# Patient Record
Sex: Male | Born: 1960
Health system: Southern US, Community
[De-identification: ages and names within clinical notes are randomized; demographics above are authoritative.]

## PROBLEM LIST (undated history)

## (undated) DIAGNOSIS — K635 Polyp of colon: Secondary | ICD-10-CM

## (undated) DIAGNOSIS — F419 Anxiety disorder, unspecified: Secondary | ICD-10-CM

## (undated) DIAGNOSIS — I10 Essential (primary) hypertension: Secondary | ICD-10-CM

## (undated) HISTORY — DX: Essential (primary) hypertension: I10

## (undated) HISTORY — DX: Anxiety disorder, unspecified: F41.9

## (undated) HISTORY — PX: OTHER SURGICAL HISTORY: SHX169

## (undated) HISTORY — PX: COLONOSCOPY: SHX174

## (undated) HISTORY — DX: Polyp of colon: K63.5

## (undated) HISTORY — PX: LUNG SURGERY: SHX703

---

## 2008-07-03 ENCOUNTER — Ambulatory Visit: Payer: Self-pay | Admitting: Family Medicine

## 2008-07-03 DIAGNOSIS — J069 Acute upper respiratory infection, unspecified: Secondary | ICD-10-CM | POA: Insufficient documentation

## 2011-07-01 ENCOUNTER — Ambulatory Visit: Payer: Self-pay | Admitting: Family Medicine

## 2011-10-15 ENCOUNTER — Ambulatory Visit (INDEPENDENT_AMBULATORY_CARE_PROVIDER_SITE_OTHER): Payer: BC Managed Care – PPO | Admitting: Physician Assistant

## 2011-10-15 ENCOUNTER — Encounter: Payer: Self-pay | Admitting: Physician Assistant

## 2011-10-15 VITALS — BP 118/80 | HR 91 | Ht 70.0 in | Wt 146.0 lb

## 2011-10-15 DIAGNOSIS — Z129 Encounter for screening for malignant neoplasm, site unspecified: Secondary | ICD-10-CM

## 2011-10-15 DIAGNOSIS — Z1211 Encounter for screening for malignant neoplasm of colon: Secondary | ICD-10-CM

## 2011-10-15 DIAGNOSIS — Z716 Tobacco abuse counseling: Secondary | ICD-10-CM

## 2011-10-15 DIAGNOSIS — Z23 Encounter for immunization: Secondary | ICD-10-CM

## 2011-10-15 DIAGNOSIS — Z0000 Encounter for general adult medical examination without abnormal findings: Secondary | ICD-10-CM

## 2011-10-15 DIAGNOSIS — F17200 Nicotine dependence, unspecified, uncomplicated: Secondary | ICD-10-CM

## 2011-10-15 MED ORDER — BUPROPION HCL ER (SR) 150 MG PO TB12: 150.0000 mg | ORAL_TABLET | Freq: Two times a day (BID) | ORAL | Status: DC

## 2011-10-15 NOTE — Patient Instructions (Addendum)
Will give prescription for Wellbutrin for smoking cessation. Can prescribe for another month if find this is helping. Will call with lab results. Will refer for colonoscopy in September and CT screening. Gave tetanus today. Continue to maintain an healthy diet with regular exercise. Try to get 4 servings of dairy daily.    Smoking Cessation This document explains the best ways for you to quit smoking and new treatments to help. It lists new medicines that can double or triple your chances of quitting and quitting for good. It also considers ways to avoid relapses and concerns you may have about quitting, including weight gain. NICOTINE: A POWERFUL ADDICTION If you have tried to quit smoking, you know how hard it can be. It is hard because nicotine is a very addictive drug. For some people, it can be as addictive as heroin or cocaine. Usually, people make 2 or 3 tries, or more, before finally being able to quit. Each time you try to quit, you can learn about what helps and what hurts. Quitting takes hard work and a lot of effort, but you can quit smoking. QUITTING SMOKING IS ONE OF THE MOST IMPORTANT THINGS YOU WILL EVER DO.  You will live longer, feel better, and live better.   The impact on your body of quitting smoking is felt almost immediately:   Within 20 minutes, blood pressure decreases. Pulse returns to its normal level.   After 8 hours, carbon monoxide levels in the blood return to normal. Oxygen level increases.   After 24 hours, chance of heart attack starts to decrease. Breath, hair, and body stop smelling like smoke.   After 48 hours, damaged nerve endings begin to recover. Sense of taste and smell improve.   After 72 hours, the body is virtually free of nicotine. Bronchial tubes relax and breathing becomes easier.   After 2 to 12 weeks, lungs can hold more air. Exercise becomes easier and circulation improves.   Quitting will reduce your risk of having a heart attack, stroke,  cancer, or lung disease:   After 1 year, the risk of coronary heart disease is cut in half.   After 5 years, the risk of stroke falls to the same as a nonsmoker.   After 10 years, the risk of lung cancer is cut in half and the risk of other cancers decreases significantly.   After 15 years, the risk of coronary heart disease drops, usually to the level of a nonsmoker.   If you are pregnant, quitting smoking will improve your chances of having a healthy baby.   The people you live with, especially your children, will be healthier.   You will have extra money to spend on things other than cigarettes.  FIVE KEYS TO QUITTING Studies have shown that these 5 steps will help you quit smoking and quit for good. You have the best chances of quitting if you use them together: 1. Get ready.  2. Get support and encouragement.  3. Learn new skills and behaviors.  4. Get medicine to reduce your nicotine addiction and use it correctly.  5. Be prepared for relapse or difficult situations. Be determined to continue trying to quit, even if you do not succeed at first.  1. GET READY  Set a quit date.   Change your environment.   Get rid of ALL cigarettes, ashtrays, matches, and lighters in your home, car, and place of work.   Do not let people smoke in your home.   Review your  past attempts to quit. Think about what worked and what did not.   Once you quit, do not smoke. NOT EVEN A PUFF!  2. GET SUPPORT AND ENCOURAGEMENT Studies have shown that you have a better chance of being successful if you have help. You can get support in many ways.  Tell your family, friends, and coworkers that you are going to quit and need their support. Ask them not to smoke around you.   Talk to your caregivers (doctor, dentist, nurse, pharmacist, psychologist, and/or smoking counselor).   Get individual, group, or telephone counseling and support. The more counseling you have, the better your chances are of  quitting. Programs are available at [employer] and health centers. Call your local health department for information about programs in your area.   Spiritual beliefs and practices may help some smokers quit.   Quit meters are Photographer that keep track of quit statistics, such as amount of "quit-time," cigarettes not smoked, and money saved.   Many smokers find one or more of the many self-help books available useful in helping them quit and stay off tobacco.  3. LEARN NEW SKILLS AND BEHAVIORS  Try to distract yourself from urges to smoke. Talk to someone, go for a walk, or occupy your time with a task.   When you first try to quit, change your routine. Take a different route to work. Drink tea instead of coffee. Eat breakfast in a different place.   Do something to reduce your stress. Take a hot bath, exercise, or read a book.   Plan something enjoyable to do every day. Reward yourself for not smoking.   Explore interactive web-based programs that specialize in helping you quit.  4. GET MEDICINE AND USE IT CORRECTLY Medicines can help you stop smoking and decrease the urge to smoke. Combining medicine with the above behavioral methods and support can quadruple your chances of successfully quitting smoking. The U[place]. Food and Drug Administration (FDA) has approved 7 medicines to help you quit smoking. These medicines fall into 3 categories.  Nicotine replacement therapy (delivers nicotine to your body without the negative effects and risks of smoking):   Nicotine gum: Available over-the-counter.   Nicotine lozenges: Available over-the-counter.   Nicotine inhaler: Available by prescription.   Nicotine nasal spray: Available by prescription.   Nicotine skin patches (transdermal): Available by prescription and over-the-counter.   Antidepressant medicine (helps people abstain from smoking, but how this works is unknown):   Bupropion  sustained-release (SR) tablets: Available by prescription.   Nicotinic receptor partial agonist (simulates the effect of nicotine in your brain):   Varenicline tartrate tablets: Available by prescription.   Ask your caregiver for advice about which medicines to use and how to use them. Carefully read the information on the package.   Everyone who is trying to quit may benefit from using a medicine. If you are pregnant or trying to become pregnant, nursing an infant, you are under age 72, or you smoke fewer than 10 cigarettes per day, talk to your caregiver before taking any nicotine replacement medicines.   You should stop using a nicotine replacement product and call your caregiver if you experience nausea, dizziness, weakness, vomiting, fast or irregular heartbeat, mouth problems with the lozenge or gum, or redness or swelling of the skin around the patch that does not go away.   Do not use any other product containing nicotine while using a nicotine replacement product.   Talk to  your caregiver before using these products if you have diabetes, heart disease, asthma, stomach ulcers, you had a recent heart attack, you have high blood pressure that is not controlled with medicine, a history of irregular heartbeat, or you have been prescribed medicine to help you quit smoking.  5. BE PREPARED FOR RELAPSE OR DIFFICULT SITUATIONS  Most relapses occur within the first 3 months after quitting. Do not be discouraged if you start smoking again. Remember, most people try several times before they finally quit.   You may have symptoms of withdrawal because your body is used to nicotine. You may crave cigarettes, be irritable, feel very hungry, cough often, get headaches, or have difficulty concentrating.   The withdrawal symptoms are only temporary. They are strongest when you first quit, but they will go away within 10 to 14 days.  Here are some difficult situations to watch for:  Alcohol. Avoid  drinking alcohol. Drinking lowers your chances of successfully quitting.   Caffeine. Try to reduce the amount of caffeine you consume. It also lowers your chances of successfully quitting.   Other smokers. Being around smoking can make you want to smoke. Avoid smokers.   Weight gain. Many smokers will gain weight when they quit, usually less than 10 pounds. Eat a healthy diet and stay active. Do not let weight gain distract you from your main goal, quitting smoking. Some medicines that help you quit smoking may also help delay weight gain. You can always lose the weight gained after you quit.   Bad mood or depression. There are a lot of ways to improve your mood other than smoking.  If you are having problems with any of these situations, talk to your caregiver. SPECIAL SITUATIONS AND CONDITIONS Studies suggest that everyone can quit smoking. Your situation or condition can give you a special reason to quit.  Pregnant women/new mothers: By quitting, you protect your baby's health and your own.   Hospitalized patients: By quitting, you reduce health problems and help healing.   Heart attack patients: By quitting, you reduce your risk of a second heart attack.   Lung, head, and neck cancer patients: By quitting, you reduce your chance of a second cancer.   Parents of children and adolescents: By quitting, you protect your children from illnesses caused by secondhand smoke.  QUESTIONS TO THINK ABOUT Think about the following questions before you try to stop smoking. You may want to talk about your answers with your caregiver.  Why do you want to quit?   If you tried to quit in the past, what helped and what did not?   What will be the most difficult situations for you after you quit? How will you plan to handle them?   Who can help you through the tough times? Your family? Friends? Caregiver?   What pleasures do you get from smoking? What ways can you still get pleasure if you quit?    Here are some questions to ask your caregiver:  How can you help me to be successful at quitting?   What medicine do you think would be best for me and how should I take it?   What should I do if I need more help?   What is smoking withdrawal like? How can I get information on withdrawal?  Quitting takes hard work and a lot of effort, but you can quit smoking. FOR MORE INFORMATION  Smokefree.gov (http://www.davis-sullivan.com/) provides free, accurate, evidence-based information and professional assistance to help support the  immediate and long-term needs of people trying to quit smoking. Document Released: 05/06/2001 Document Revised: 05/01/2011 Document Reviewed: 02/26/2009 [facility] Patient Information 2012 [place], [place].

## 2011-10-15 NOTE — Progress Notes (Signed)
  Subjective:    Patient ID: Jeff Aguirre, male    DOB: 13-Jan-1961, 51 y.o.   MRN: [id number]  HPI Patient presents to clinic for CPE. He is doing well today with no concerns or compliants. He feels great. He does smoke and knows he needs to quit. Has been trying to cut back from about a pack a day to now 1/2 to 3/4 packs a day. He has not tried anything to help him stop smoking. His aunt did try chantix and it caused her to have bad dreams and crazy thoughts. He has considered used cigarettes but has not made that to purchase them.  He does exercise regularly at least 4-5 times a week for more than 30 minutes daily. He reports to have a balanced diet watching high intake of fatty foods, fried foods, foods high in saturated fat.   Needs tetanus and colonoscopy.      Review of Systems     Objective:   Physical Exam  Constitutional: He is oriented to person, place, and time. He appears well-developed and well-nourished.       Appears in good physical shape.  HENT:  Head: Normocephalic and atraumatic.  Right Ear: External ear normal.  Left Ear: External ear normal.  Nose: Nose normal.  Mouth/Throat: Oropharynx is clear and moist. No oropharyngeal exudate.       TMs were normal bilaterally.  Eyes: Conjunctivae and EOM are normal. Pupils are equal, round, and reactive to light.  Neck: Normal range of motion. Neck supple. No JVD present. No tracheal deviation present. No thyromegaly present.  Cardiovascular: Normal rate, regular rhythm, normal heart sounds and intact distal pulses.        Pedal pulses were 2+ bilaterally.  Pulmonary/Chest: Effort normal and breath sounds normal. He has no wheezes.  Abdominal: Soft. Bowel sounds are normal. He exhibits no distension and no mass. There is no tenderness. There is no rebound and no guarding.  Genitourinary: Rectum normal, prostate normal and penis normal. Guaiac negative stool. No penile tenderness.  Musculoskeletal: Normal range of motion.   Lymphadenopathy:    He has no cervical adenopathy.  Neurological: He is alert and oriented to person, place, and time. He has normal reflexes. No cranial nerve deficit.  Skin:       No signs of edema or color changes of both legs.  Psychiatric: He has a normal mood and affect. His behavior is normal.          Assessment & Plan:  CPE-Got labs today will call with results. Hemocult was negative. Continue with balanced diet and regular exercise. Consider increasing calcium if not already having an intake of 4 servings of dairy per day.will refer for colonoscopy in September. We will arrange CT of chest to screen for colon cancer do to extensive smoking history.  Tetanus was given in office today.  Smoking cessation- gave handout on benefit of quiting. Discuss options for helping to stop. Gave Wellbutrin to take for next 2 months. Educated to set quit date 1-2 weeks after starting therapy. Follow up in 2 months if need refill and to go over progress.

## 2011-10-16 LAB — COMPLETE METABOLIC PANEL WITH GFR
ALT: 18 U/L (ref 0–53)
AST: 24 U/L (ref 0–37)
Albumin: 4.4 g/dL (ref 3.5–5.2)
Alkaline Phosphatase: 80 U/L (ref 39–117)
BUN: 7 mg/dL (ref 6–23)
CO2: 26 mEq/L (ref 19–32)
Calcium: 9.4 mg/dL (ref 8.4–10.5)
Chloride: 104 mEq/L (ref 96–112)
Creat: 0.69 mg/dL (ref 0.50–1.35)
GFR, Est African American: >89 mL/min
GFR, Est Non African American: >89 mL/min
Glucose, Bld: 74 mg/dL (ref 70–99)
Potassium: 4.5 mEq/L (ref 3.5–5.3)
Sodium: 138 mEq/L (ref 135–145)
Total Bilirubin: 0.6 mg/dL (ref 0.3–1.2)
Total Protein: 6.8 g/dL (ref 6.0–8.3)

## 2011-10-16 LAB — LIPID PANEL
Cholesterol: 176 mg/dL (ref 0–200)
HDL: 70 mg/dL (ref >39)
LDL Cholesterol: 78 mg/dL (ref 0–99)
Total CHOL/HDL Ratio: 2.5 Ratio
Triglycerides: 138 mg/dL (ref <150)
VLDL: 28 mg/dL (ref 0–40)

## 2011-10-16 LAB — PSA, TOTAL AND FREE
PSA, Free Pct: 33 % (ref >25)
PSA, Free: 0.36 ng/mL
PSA: 1.09 ng/mL (ref <=4.00)

## 2011-10-17 ENCOUNTER — Encounter: Payer: Self-pay | Admitting: Physician Assistant

## 2011-10-17 DIAGNOSIS — F17200 Nicotine dependence, unspecified, uncomplicated: Secondary | ICD-10-CM | POA: Insufficient documentation

## 2011-11-05 ENCOUNTER — Other Ambulatory Visit: Payer: BC Managed Care – PPO

## 2012-02-23 ENCOUNTER — Encounter: Payer: Self-pay | Admitting: Physician Assistant

## 2012-02-23 DIAGNOSIS — D369 Benign neoplasm, unspecified site: Secondary | ICD-10-CM | POA: Insufficient documentation

## 2012-03-04 ENCOUNTER — Encounter: Payer: Self-pay | Admitting: *Deleted

## 2012-08-18 ENCOUNTER — Ambulatory Visit: Payer: BC Managed Care – PPO | Admitting: Family Medicine

## 2016-02-18 ENCOUNTER — Encounter: Payer: Self-pay | Admitting: Osteopathic Medicine

## 2016-02-18 ENCOUNTER — Ambulatory Visit (INDEPENDENT_AMBULATORY_CARE_PROVIDER_SITE_OTHER): Payer: BLUE CROSS/BLUE SHIELD | Admitting: Osteopathic Medicine

## 2016-02-18 VITALS — BP 165/100 | HR 88 | Ht 70.0 in | Wt 149.0 lb

## 2016-02-18 DIAGNOSIS — F411 Generalized anxiety disorder: Secondary | ICD-10-CM | POA: Diagnosis not present

## 2016-02-18 DIAGNOSIS — I10 Essential (primary) hypertension: Secondary | ICD-10-CM | POA: Diagnosis not present

## 2016-02-18 DIAGNOSIS — F410 Panic disorder [episodic paroxysmal anxiety] without agoraphobia: Secondary | ICD-10-CM | POA: Diagnosis not present

## 2016-02-18 MED ORDER — PROPRANOLOL HCL 80 MG PO TABS: 80.0000 mg | ORAL_TABLET | Freq: Three times a day (TID) | ORAL | 0 refills | Status: DC

## 2016-02-18 MED ORDER — ESCITALOPRAM OXALATE 10 MG PO TABS: 10.0000 mg | ORAL_TABLET | Freq: Every day | ORAL | 1 refills | Status: DC

## 2016-02-18 NOTE — Progress Notes (Signed)
HPI: Jeff Aguirre is a 55 y.o. male  who presents to [place] today, 02/18/16,  for chief complaint of:  Chief Complaint  Patient presents with  . Anxiety  . Hypertension     Anxiety: Patient reports had panic attack yesterday. He is significantly afraid of driving, specifically going over tall bridges. He finds that he has a very hard time performing his job, which does involve significant amount of driving. This is been going on total for about 3 months or so, specific triggers. Increased anxiety mostly due to worried about job security given that he is unable to perform his duties effectively given his anxiety issues around driving. Has never been hospitalized for psychiatric reasons in the past. No history of other triggers to panic attacks. Patient does not want any medicines that are going to "knock me out" he would like to continue driving.  Excessive anxiety/worry 6 mos+: No  Home and work/school: No  Difficulty to control: Yes  Associated symptoms (3/adult, 1/child):     Restlessness/on edge Yes      Fatigue No      Concentration Yes      Irritable Yes      Muscle tension Yes      Sleep disturbance No  Imparled social/occupational functioning: Yes  Any secondary cause or other concerns?   Major depression No concern  Personality d/o No concern  Social anxiety d/o No concern  Obsessive Compulsive d/o No concern  PTSD No concern  Eating d/o or Body Dysmorphic d/o No concern  Hypertension: Patient states blood pressure occasionally high like this when he is anxious, never had to be on blood pressure medications in the past. No chest pain/trouble breathing.  Patient is accompanied by daughter who assists with history-taking.   Past medical, surgical, social and family history reviewed: No past medical history on file. Past Surgical History:  Procedure Laterality Date  . growth on testicle removed  2000's   benign   Social History   Substance Use Topics  . Smoking status: Current Every Day Smoker    Packs/day: 1.00    Years: 35.00    Types: Cigarettes  . Smokeless tobacco: Not on file  . Alcohol use Not on file   Family History  Problem Relation Age of Onset  . Diabetes Mother   . Hypertension Mother   . Cancer Mother     breast  . Diabetes Father   . Hypertension Father   . Heart disease Father      Current medication list and allergy/intolerance information reviewed:   No current outpatient prescriptions on file.   No current facility-administered medications for this visit.    Allergies  Allergen Reactions  . Morphine     REACTION: itching      Review of Systems:  Constitutional:  No  fever, no chills, No recent illness, No unintentional weight changes. No significant fatigue.   HEENT: No  headache, no vision change  Cardiac: No  chest pain, No  pressure, No palpitations, No  Orthopnea  Respiratory:  No  shortness of breath. No  Cough  Gastrointestinal: No  abdominal pain, No  nausea,   Musculoskeletal: No new myalgia/arthralgia  Skin: No  Rash, No other wounds/concerning lesions  Hem/Onc: No  easy bruising/bleeding, No  abnormal lymph node  Endocrine: No cold intolerance,  No heat intolerance. No polyuria/polydipsia/polyphagia   Neurologic: No  weakness, No  dizziness, No  slurred speech/focal weakness/facial droop  Psychiatric:  No  concerns with depression, +concerns with anxiety, No sleep problems, No mood problems  Exam:  BP (!) 165/100   Pulse 88   Ht 5\' 10"  (1.778 m)   Wt 149 lb (67.6 kg)   BMI 21.38 kg/m   Constitutional: VS see above. General Appearance: alert, well-developed, well-nourished, NAD  Eyes: Normal lids and conjunctive, non-icteric sclera  Ears, Nose, Mouth, Throat: MMM, Normal external inspection ears/nares/mouth/lips/gums.  Neck: No masses, trachea midline.  Respiratory: Normal respiratory effort. no wheeze, no rhonchi, no rales  Cardiovascular:  S1/S2 normal, no murmur, no rub/gallop auscultated. RRR. No lower extremity edema.   Neurological: Normal balance/coordination. No tremor.   Skin: warm, dry, intact. No rash/ulcer. Marland Kitchen    Psychiatric: Normal judgment/insight. Anxious mood and affect. Oriented x3.      ASSESSMENT/PLAN:   Essential hypertension - Initiate beta blocker, typically would not use this first line but given significant anxiety issues will try - Plan: propranolol (INDERAL) 80 MG tablet, CBC with Differential/Platelet, COMPLETE METABOLIC PANEL WITH GFR, TSH  Anxiety state - Not meeting all criteria for generalized anxiety disorder, but thinks may benefit from SSRI, certainly recommend and refer to counseling - Plan: escitalopram (LEXAPRO) 10 MG tablet, propranolol (INDERAL) 80 MG tablet, Ambulatory referral to Psychology  Panic anxiety syndrome - Triggered by driving/thinking about driving - Plan: Ambulatory referral to Psychology    Visit summary with medication list and pertinent instructions was printed for patient to review. All questions at time of visit were answered - patient instructed to contact office with any additional concerns. ER/RTC precautions were reviewed with the patient. Follow-up plan: Return in about 2 weeks (around 03/03/2016) for BLOOD PRESSURE RECHECK AND ANXIETY RECHECK (OV30).  Note: Total time spent 30 minutes, greater than 50% of the visit was spent face-to-face counseling and coordinating care for the following: The primary encounter diagnosis was Essential hypertension. Diagnoses of Anxiety state and Panic anxiety syndrome were also pertinent to this visit.Marland Kitchen

## 2016-02-19 LAB — COMPLETE METABOLIC PANEL WITH GFR
ALT: 40 U/L (ref 9–46)
AST: 60 U/L — ABNORMAL HIGH (ref 10–35)
Albumin: 5 g/dL (ref 3.6–5.1)
Alkaline Phosphatase: 92 U/L (ref 40–115)
BUN: 7 mg/dL (ref 7–25)
CO2: 24 mmol/L (ref 20–31)
Calcium: 10 mg/dL (ref 8.6–10.3)
Chloride: 95 mmol/L — ABNORMAL LOW (ref 98–110)
Creat: 0.65 mg/dL — ABNORMAL LOW (ref 0.70–1.33)
GFR, Est African American: >89 mL/min (ref >=60)
GFR, Est Non African American: >89 mL/min (ref >=60)
Glucose, Bld: 88 mg/dL (ref 65–99)
Potassium: 4.2 mmol/L (ref 3.5–5.3)
Sodium: 134 mmol/L — ABNORMAL LOW (ref 135–146)
Total Bilirubin: 0.9 mg/dL (ref 0.2–1.2)
Total Protein: 7.7 g/dL (ref 6.1–8.1)

## 2016-02-19 LAB — CBC WITH DIFFERENTIAL/PLATELET
Basophils Absolute: 85 cells/uL (ref 0–200)
Basophils Relative: 1 %
Eosinophils Absolute: 170 cells/uL (ref 15–500)
Eosinophils Relative: 2 %
HCT: 44.7 % (ref 38.5–50.0)
Hemoglobin: 15.4 g/dL (ref 13.2–17.1)
Lymphocytes Relative: 21 %
Lymphs Abs: 1785 cells/uL (ref 850–3900)
MCH: 32.6 pg (ref 27.0–33.0)
MCHC: 34.5 g/dL (ref 32.0–36.0)
MCV: 94.7 fL (ref 80.0–100.0)
MPV: 10.4 fL (ref 7.5–12.5)
Monocytes Absolute: 850 cells/uL (ref 200–950)
Monocytes Relative: 10 %
Neutro Abs: 5610 cells/uL (ref 1500–7800)
Neutrophils Relative %: 66 %
Platelets: 179 10*3/uL (ref 140–400)
RBC: 4.72 MIL/uL (ref 4.20–5.80)
RDW: 13.9 % (ref 11.0–15.0)
WBC: 8.5 10*3/uL (ref 3.8–10.8)

## 2016-02-19 LAB — TSH: TSH: 1.78 mIU/L (ref 0.40–4.50)

## 2016-03-06 ENCOUNTER — Encounter: Payer: Self-pay | Admitting: Osteopathic Medicine

## 2016-03-06 ENCOUNTER — Ambulatory Visit (INDEPENDENT_AMBULATORY_CARE_PROVIDER_SITE_OTHER): Payer: BLUE CROSS/BLUE SHIELD | Admitting: Osteopathic Medicine

## 2016-03-06 VITALS — BP 155/89 | HR 69 | Ht 70.0 in | Wt 148.0 lb

## 2016-03-06 DIAGNOSIS — E871 Hypo-osmolality and hyponatremia: Secondary | ICD-10-CM

## 2016-03-06 DIAGNOSIS — R748 Abnormal levels of other serum enzymes: Secondary | ICD-10-CM

## 2016-03-06 DIAGNOSIS — I10 Essential (primary) hypertension: Secondary | ICD-10-CM

## 2016-03-06 DIAGNOSIS — F411 Generalized anxiety disorder: Secondary | ICD-10-CM

## 2016-03-06 MED ORDER — ESCITALOPRAM OXALATE 20 MG PO TABS: 20.0000 mg | ORAL_TABLET | Freq: Every day | ORAL | 1 refills | Status: DC

## 2016-03-06 MED ORDER — PROPRANOLOL HCL ER 80 MG PO CP24: 80.0000 mg | ORAL_CAPSULE | Freq: Every day | ORAL | 1 refills | Status: DC

## 2016-03-06 NOTE — Progress Notes (Signed)
HPI: Jeff Aguirre is a 55 y.o. male  who presents to [place] today, 03/06/16,  for chief complaint of:  Chief Complaint  Patient presents with  . Follow-up    anxiety    Anxiety: Still concerned about driving anxiety - Lexapro at one pill Daily up from half a pill. Propranolol 3 times a day no has been taking only twice daily to help stretch out medications as his prescription was running a bit low. Overall, his anxiety levels feel a bit lower, he is still nervous to drive and requests letter stating such to his employer. Employer has been understanding with regard to situational anxiety.  Hypertension: See above regarding propranolol. No chest pain, pressure, shortness of breath. Blood pressure improved from previous visit.  Elevated liver enzymes: Patient recently had elevated liver enzyme on CMP. He endorses occasional alcohol use, reports occasional use to help him get to sleep.  Past medical, surgical, social and family history reviewed: No past medical history on file. Past Surgical History:  Procedure Laterality Date  . growth on testicle removed  2000's   benign   Social History  Substance Use Topics  . Smoking status: Current Every Day Smoker    Packs/day: 1.00    Years: 35.00    Types: Cigarettes  . Smokeless tobacco: Not on file  . Alcohol use Not on file   Family History  Problem Relation Age of Onset  . Diabetes Mother   . Hypertension Mother   . Cancer Mother     breast  . Diabetes Father   . Hypertension Father   . Heart disease Father      Current medication list and allergy/intolerance information reviewed:   Current Outpatient Prescriptions  Medication Sig Dispense Refill  . escitalopram (LEXAPRO) 10 MG tablet Take 1 tablet (10 mg total) by mouth daily. (Start at 1/2 tablet daily for 2 weeks then can increase to 1 tablet) 30 tablet 1  . propranolol (INDERAL) 80 MG tablet Take 1 tablet (80 mg total) by mouth 3  (three) times daily. 30 tablet 0   No current facility-administered medications for this visit.    Allergies  Allergen Reactions  . Morphine     REACTION: itching      Review of Systems:  Constitutional:  No  fever, no chills, No recent illness  HEENT: No  headache, no vision change,  Cardiac: No  chest pain, No  pressure  Respiratory:  No  shortness of breath.   Neurologic: No  weakness, No  dizziness,   Psychiatric: No  concerns with depression, +concerns with anxiety, No sleep problems, No mood problems  Exam:  BP (!) 155/89   Pulse 69   Ht 5\' 10"  (1.778 m)   Wt 148 lb (67.1 kg)   BMI 21.24 kg/m   Constitutional: VS see above. General Appearance: alert, well-developed, well-nourished, NAD  Respiratory: Normal respiratory effort. no wheeze, no rhonchi, no rales  Cardiovascular: S1/S2 normal, no murmur, no rub/gallop auscultated. RRR. Marland Kitchen  Psychiatric: Normal judgment/insight. Normal mood and affect. Oriented x3.      ASSESSMENT/PLAN:    Essential hypertension - Plan: propranolol ER (INDERAL [place]) 80 MG 24 hr capsule  Anxiety state - Not meeting criteria for generalized anxiety disorder, but thinks may benefit from SSRI, certainly recommend and refer to counseling, has appt for beh. heal - Plan: escitalopram (LEXAPRO) 20 MG tablet  Elevated liver enzymes - Plan: COMPLETE METABOLIC PANEL WITH GFR   Alcohol  use as self medication for anxiety/sleep, may explain elevated liver enzyme, discussed this with patient in terms of risky behavior, potential for dependence or abuse. Patient has good insight into this issue, recognizes that not an ideal way to approach concerns about insomnia/stress. We'll check in with this on subsequent visits.  Visit summary with medication list and pertinent instructions was printed for patient to review. All questions at time of visit were answered - patient instructed to contact office with any additional concerns. ER/RTC precautions were  reviewed with the patient. Follow-up plan: Return in about 4 weeks (around 04/03/2016) for BP RECHECK [place] .

## 2016-03-07 LAB — COMPLETE METABOLIC PANEL WITH GFR
ALT: 50 U/L — ABNORMAL HIGH (ref 9–46)
AST: 49 U/L — ABNORMAL HIGH (ref 10–35)
Albumin: 4.5 g/dL (ref 3.6–5.1)
Alkaline Phosphatase: 77 U/L (ref 40–115)
BUN: 8 mg/dL (ref 7–25)
CO2: 22 mmol/L (ref 20–31)
Calcium: 9.5 mg/dL (ref 8.6–10.3)
Chloride: 97 mmol/L — ABNORMAL LOW (ref 98–110)
Creat: 0.59 mg/dL — ABNORMAL LOW (ref 0.70–1.33)
GFR, Est African American: >89 mL/min (ref >=60)
GFR, Est Non African American: >89 mL/min (ref >=60)
Glucose, Bld: 83 mg/dL (ref 65–99)
Potassium: 4.3 mmol/L (ref 3.5–5.3)
Sodium: 131 mmol/L — ABNORMAL LOW (ref 135–146)
Total Bilirubin: 0.7 mg/dL (ref 0.2–1.2)
Total Protein: 6.9 g/dL (ref 6.1–8.1)

## 2016-03-07 NOTE — Addendum Note (Signed)
Addended by: Maryla Morrow on: 03/07/2016 01:51 PM   Modules accepted: Orders

## 2016-03-11 LAB — CMP AND LIVER
ALT: 44 U/L (ref 9–46)
AST: 47 U/L — ABNORMAL HIGH (ref 10–35)
Albumin: 4.6 g/dL (ref 3.6–5.1)
Alkaline Phosphatase: 82 U/L (ref 40–115)
BUN: 8 mg/dL (ref 7–25)
Bilirubin, Direct: 0.2 mg/dL (ref <=0.2)
CO2: 21 mmol/L (ref 20–31)
Calcium: 9.8 mg/dL (ref 8.6–10.3)
Chloride: 99 mmol/L (ref 98–110)
Creat: 0.71 mg/dL (ref 0.70–1.33)
Glucose, Bld: 86 mg/dL (ref 65–99)
Indirect Bilirubin: 0.6 mg/dL (ref 0.2–1.2)
Potassium: 4.5 mmol/L (ref 3.5–5.3)
Sodium: 136 mmol/L (ref 135–146)
Total Bilirubin: 0.8 mg/dL (ref 0.2–1.2)
Total Protein: 7.4 g/dL (ref 6.1–8.1)

## 2016-03-11 LAB — HEPATIC FUNCTION PANEL
ALT: 44 U/L (ref 9–46)
AST: 47 U/L — ABNORMAL HIGH (ref 10–35)
Albumin: 4.6 g/dL (ref 3.6–5.1)
Alkaline Phosphatase: 82 U/L (ref 40–115)
Bilirubin, Direct: 0.2 mg/dL (ref <=0.2)
Indirect Bilirubin: 0.6 mg/dL (ref 0.2–1.2)
Total Bilirubin: 0.8 mg/dL (ref 0.2–1.2)
Total Protein: 7.4 g/dL (ref 6.1–8.1)

## 2016-03-11 LAB — CREATININE, URINE, RANDOM: Creatinine, Urine: 156 mg/dL (ref 20–370)

## 2016-03-11 LAB — OSMOLALITY: Osmolality: 274 mOsm/kg — ABNORMAL LOW (ref 278–305)

## 2016-03-11 LAB — SODIUM, URINE, RANDOM: Sodium, Ur: 88 mmol/L (ref 28–272)

## 2016-04-01 ENCOUNTER — Ambulatory Visit (INDEPENDENT_AMBULATORY_CARE_PROVIDER_SITE_OTHER): Payer: BLUE CROSS/BLUE SHIELD | Admitting: Licensed Clinical Social Worker

## 2016-04-01 DIAGNOSIS — F40242 Fear of bridges: Secondary | ICD-10-CM | POA: Diagnosis not present

## 2016-04-02 NOTE — Progress Notes (Signed)
Comprehensive Clinical Assessment (CCA) Note  04/02/2016 [facility] [id number]  Visit Diagnosis:      ICD-9-CM ICD-10-CM   1. Fear of bridges 300.29 F40.242       CCA Part One  Part One has been completed on paper by the patient.  (See scanned document in Chart Review)  CCA Part Two A  Intake/Chief Complaint:  CCA Intake With Chief Complaint CCA Part Two Date: 04/01/16 CCA Part Two Time: 1610 Chief Complaint/Presenting Problem: I'm petrified to get on the highway.  I start shaking uncontrollably and just about lose control of the car.   Patients Currently Reported Symptoms/Problems: Anxiety has been apparent for past 6 months.  Has worsened over time.  It started after a visit to [place], [place] last year where there are some very high bridges.  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  Takes alternate routes to avoid the highway.  Last went on highway in late September.  Had a panic attack.  Made appointment with doctor the following day.  Prescribed Lexapro and propranolol.         Individual's Strengths: Very organized, meticulous, goal-oriented, fairly good on the computer    Supportive family Individual's Preferences: Wants to be able to resume driving so that he can continue working at his place of employment Initial Clinical Notes/Concerns: Not having to drive right now at work.  [name] is understanding.  Doing office work in the meantime.    Mental Health Symptoms Depression:  Depression: Worthlessness  Mania:  Mania: N/A  Anxiety:   Anxiety: Worrying, Tension  Psychosis:  Psychosis: N/A  Trauma:  Trauma: N/A  Obsessions:  Obsessions: N/A  Compulsions:  Compulsions: N/A  Inattention:  Inattention: N/A  Hyperactivity/Impulsivity:  Hyperactivity/Impulsivity: N/A  Oppositional/Defiant Behaviors:   Oppositional/Defiant Behaviors: N/A  Borderline Personality:  Emotional Irregularity: N/A  Other Mood/Personality Symptoms:      Mental Status Exam Appearance and self-care  Stature:  Stature: Average  Weight:  Weight: Average weight  Clothing:  Clothing: Casual  Grooming:  Grooming: Normal  Cosmetic use:  Cosmetic Use: None  Posture/gait:  Posture/Gait: Normal  Motor activity:  Motor Activity: Not Remarkable  Sensorium  Attention:  Attention: Normal  Concentration:  Concentration: Normal  Orientation:  Orientation: X5  Recall/memory:  Recall/Memory: Normal  Affect and Mood  Affect:  Affect: Appropriate  Mood:  Mood: Anxious  Relating  Eye contact:  Eye Contact: Normal  Facial expression:  Facial Expression: Responsive  Attitude toward examiner:  Attitude Toward Examiner: Cooperative  Thought and Language  Speech flow: Speech Flow: Normal  Thought content:     Preoccupation:     Hallucinations:     Organization:     Transport planner of Knowledge:  Fund of Knowledge: Average  Intelligence:  Intelligence: Average  Abstraction:  Abstraction: Normal  Judgement:  Judgement: Normal  Reality Testing:  Reality Testing: Adequate  Insight:  Insight: Good  Decision Making:  Decision Making: Normal  Social Functioning  Social Maturity:  Social Maturity: Responsible  Social Judgement:  Social Judgement: Normal  Stress  Stressors:     Coping Ability:  Coping Ability: English as a second language teacher Deficits:     Supports:      Family and Psychosocial History: Family history Marital status: Separated Separated, when?: 3 years ago What  types of issues is patient dealing with in the relationship?: We just weren't getting along.  We get along better now that we are apart.   Does patient have children?: Yes How many children?: 1 How is patient's relationship with their children?: Stepdaughter, Dawn (46)- very close   Childhood History:  Childhood History By whom was/is the patient  raised?: Both parents Description of patient's relationship with caregiver when they were a child: "Great" Patient's description of current relationship with people who raised him/her: Both parents are deceased.  Mom in 1989  Dad 2012 Does patient have siblings?: Yes Number of Siblings: 2 Description of patient's current relationship with siblings: Brother, Coralyn Mark (54) and sister, Sharee Pimple (17)- good relationship with both Did patient suffer any verbal/emotional/physical/sexual abuse as a child?: No Did patient suffer from severe childhood neglect?: No Has patient ever been sexually abused/assaulted/raped as an adolescent or adult?: No Was the patient ever a victim of a crime or a disaster?: No Witnessed domestic violence?: No Has patient been effected by domestic violence as an adult?: No  CCA Part Two B  Employment/Work Situation: Employment / Work Copywriter, advertising Employment situation: Employed (Full time) Patient's job has been impacted by current illness: Yes What is the longest time patient has a held a job?: 10 years 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  Has patient ever been in the [employer]?: Yes (Describe in comment) 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) Has patient ever served in combat?: No Did You Receive Any Psychiatric Treatment/Services While in Passenger transport manager?: No Are There Guns or Other Weapons in [place]?: No  Education: Education Did Teacher, adult education From [employer]?: Yes Did Physicist, medical?: No Did You Have Any Difficulty At [employer]?: No  Religion: Religion/Spirituality Are You A Religious Person?: Yes (Attends church sometimes) What is Your Religious Affiliation?: Psychologist, clinical: Leisure / Recreation Leisure and Hobbies: Spend time with his dog, plays an Pension scheme manager  Exercise/Diet: Exercise/Diet Do You Exercise?: Yes (With his job) Have You Gained or Lost A Significant Amount of Weight in the Past Six Months?: No Do  You Follow a Special Diet?: No Do You Have Any Trouble Sleeping?: No  CCA Part Two C  Alcohol/Drug Use: Alcohol / Drug Use History of alcohol / drug use?: No history of alcohol / drug abuse                      CCA Part Three  ASAM's:  Six Dimensions of Multidimensional Assessment  Dimension 1:  Acute Intoxication and/or Withdrawal Potential:     Dimension 2:  Biomedical Conditions and Complications:     Dimension 3:  Emotional, Behavioral, or Cognitive Conditions and Complications:     Dimension 4:  Readiness to Change:     Dimension 5:  Relapse, Continued use, or Continued Problem Potential:     Dimension 6:  Recovery/Living Environment:      Substance use Disorder (SUD)    Social Function:  Social Functioning Social Maturity: Responsible Social Judgement: Normal  Stress:  Stress Coping Ability: Overwhelmed Patient Takes Medications The Way The Doctor Instructed?: Yes  Risk Assessment- Self-Harm Potential: Risk Assessment For Self-Harm Potential Thoughts of Self-Harm: No current thoughts Additional Comments for Self-Harm Potential: Denies history of harm to self  Risk Assessment -Dangerous to Others Potential: Risk Assessment For Dangerous to Others Potential Method: No Plan Additional Comments for Danger to Others Potential: Denies history of harm to others  DSM5 Diagnoses: Patient  Active Problem List   Diagnosis Date Noted  . Essential hypertension 02/18/2016  . Anxiety state 02/18/2016  . Panic anxiety syndrome 02/18/2016  . Adenomatous polyps 02/23/2012  . Active smoker 10/17/2011  . URI 07/03/2008      Recommendations for Services/Supports/Treatments: Recommendations for Services/Supports/Treatments Recommendations For Services/Supports/Treatments: Individual Therapy    Garnette Scheuermann

## 2016-04-07 ENCOUNTER — Ambulatory Visit: Payer: BLUE CROSS/BLUE SHIELD | Admitting: Osteopathic Medicine

## 2016-04-08 ENCOUNTER — Ambulatory Visit (INDEPENDENT_AMBULATORY_CARE_PROVIDER_SITE_OTHER): Payer: BLUE CROSS/BLUE SHIELD | Admitting: Osteopathic Medicine

## 2016-04-08 ENCOUNTER — Encounter: Payer: Self-pay | Admitting: Osteopathic Medicine

## 2016-04-08 VITALS — BP 155/90 | HR 79 | Wt 147.0 lb

## 2016-04-08 DIAGNOSIS — F411 Generalized anxiety disorder: Secondary | ICD-10-CM

## 2016-04-08 DIAGNOSIS — E871 Hypo-osmolality and hyponatremia: Secondary | ICD-10-CM | POA: Diagnosis not present

## 2016-04-08 DIAGNOSIS — R748 Abnormal levels of other serum enzymes: Secondary | ICD-10-CM

## 2016-04-08 DIAGNOSIS — I10 Essential (primary) hypertension: Secondary | ICD-10-CM | POA: Diagnosis not present

## 2016-04-08 MED ORDER — PROPRANOLOL HCL ER 120 MG PO CP24: 120.0000 mg | ORAL_CAPSULE | Freq: Every day | ORAL | 3 refills | Status: DC

## 2016-04-08 NOTE — Progress Notes (Signed)
HPI: Jeff Aguirre is a 55 y.o. male  who presents to [facility] today, 04/08/16,  for chief complaint of:  Chief Complaint  Patient presents with  . Follow-up    BLOOD PRESSURE     Blood pressure marginally better. Patient is on the propranolol once daily and doing okay on this medicine.  Anxiety: he is following with counseling, has had one appointment so far. Still very nervous to drive but has been avoiding highways and ring okay. Work is understanding, he has been restricted to not driving work duties. Lexapro doesn't seem to have made a great deal of difference but he is nervous to come off of this medication altogether  Hyponatremia: Repeat 03/10/16 showed improvement, serum osmolality was slightly low at that time. Meant to order urine osmolality as well but erroneously I placed order for urine creatinine, my mistake. TSH was normal.   Transaminitis: Discussed patient's drinking habits. He states that he has a beer or 2 per night. No known history of hepatitis. Repeat CMP showed some improvement but then was higher again.     Past medical, surgical, social and family history reviewed: Patient Active Problem List   Diagnosis Date Noted  . Essential hypertension 02/18/2016  . Anxiety state 02/18/2016  . Panic anxiety syndrome 02/18/2016  . Adenomatous polyps 02/23/2012  . Active smoker 10/17/2011  . URI 07/03/2008   Past Surgical History:  Procedure Laterality Date  . growth on testicle removed  2000's   benign   Social History  Substance Use Topics  . Smoking status: Current Every Day Smoker    Packs/day: 1.00    Years: 35.00    Types: Cigarettes  . Smokeless tobacco: Not on file  . Alcohol use Not on file   Family History  Problem Relation Age of Onset  . Diabetes Mother   . Hypertension Mother   . Cancer Mother     breast  . Diabetes Father   . Hypertension Father   . Heart disease Father      Current medication list  and allergy/intolerance information reviewed:   Current Outpatient Prescriptions on File Prior to Visit  Medication Sig Dispense Refill  . escitalopram (LEXAPRO) 20 MG tablet Take 1 tablet (20 mg total) by mouth daily. 30 tablet 1  . propranolol ER (INDERAL LA) 80 MG 24 hr capsule Take 1 capsule (80 mg total) by mouth daily. 30 capsule 1   No current facility-administered medications on file prior to visit.    Allergies  Allergen Reactions  . Morphine     REACTION: itching      Review of Systems:  Constitutional: No recent illness  HEENT: No  headache, no vision change  Cardiac: No  chest pain, No  pressure, No palpitations  Respiratory:  No  shortness of breath. No  Cough  Musculoskeletal: No new myalgia/arthralgia  Skin: No  Rash  Neurologic: No  weakness, No  Dizziness  Psychiatric: No  concerns with depression, +concerns with anxiety  Exam:  BP (!) 155/90   Pulse 79   Wt 147 lb (66.7 kg)   BMI 21.09 kg/m   Constitutional: VS see above. General Appearance: alert, well-developed, well-nourished, NAD  Eyes: Normal lids and conjunctive, non-icteric sclera  Ears, Nose, Mouth, Throat: MMM, Normal external inspection ears/nares/mouth/lips/gums.  Neck: No masses, trachea midline.   Respiratory: Normal respiratory effort. no wheeze, no rhonchi, no rales  Cardiovascular: S1/S2 normal, no murmur, no rub/gallop auscultated. RRR.   Musculoskeletal:  Gait normal. Symmetric and independent movement of all extremities  Neurological: Normal balance/coordination. No tremor.  Skin: warm, dry, intact.   Psychiatric: Normal judgment/insight. Normal mood and affect. Oriented x3.    Results for orders placed or performed in visit on 04/08/16 (from the past 72 hour(s))  COMPLETE METABOLIC PANEL WITH GFR     Status: Abnormal   Collection Time: 04/08/16  4:27 PM  Result Value Ref Range   Sodium 132 (L) 135 - 146 mmol/L   Potassium 4.5 3.5 - 5.3 mmol/L   Chloride 101 98 -  110 mmol/L   CO2 22 20 - 31 mmol/L   Glucose, Bld 84 65 - 99 mg/dL   BUN 8 7 - 25 mg/dL   Creat 0.68 (L) 0.70 - 1.33 mg/dL    Comment:   For patients > or = 57 years of age: The upper reference limit for Creatinine is approximately 13% higher for people identified as African-American.      Total Bilirubin 0.8 0.2 - 1.2 mg/dL   Alkaline Phosphatase 97 40 - 115 U/L   AST 84 (H) 10 - 35 U/L   ALT 77 (H) 9 - 46 U/L   Total Protein 7.2 6.1 - 8.1 g/dL   Albumin 4.7 3.6 - 5.1 g/dL   Calcium 9.6 8.6 - 10.3 mg/dL   GFR, Est African American >89 >=60 mL/min   GFR, Est Non African American >89 >=60 mL/min     ASSESSMENT/PLAN:   Repeat CMP reviewed following patient's visit.   Initiate further workup for hyponatremia, at this point will make sure to get urine osmolality, lipids to check for pseudohyponatremia. Consider chest x-ray  We'll also get right upper quadrant just found for liver, hepatitis workup.   Called patient 04/10/16 to review results and plan of action, left voicemail for him to call clinic back. Reserve placed as below  Increase propranolol  Essential hypertension - Plan: propranolol ER (INDERAL LA) 120 MG 24 hr capsule  Hyponatremia - Previous workup demonstrated low serum osmolality, urine electrolytes were fine. Suspect SSRI effect, alcohol consumption/tea and toast diet - Plan: COMPLETE METABOLIC PANEL WITH GFR, Osmolality, Basic metabolic panel, TSH, Sodium, urine, random, Osmolality, urine, Lipid panel  Elevated liver enzymes - Patient states limited alcohol use but liver enzymes seem to be fluctuating. I have suspicion that he may not be fully truthful about alcohol consumption - Plan: COMPLETE METABOLIC PANEL WITH GFR, US Abdomen Limited RUQ, Iron and TIBC, Hepatitis C antibody, Hepatitis B surface antigen  Anxiety state - Not meeting criteria for generalized anxiety disorder, more of a phobia issue. Patient that he may benefit from antianxiety medications,  concern Lexapro dec Na+     Visit summary with medication list and pertinent instructions was printed for patient to review. All questions at time of visit were answered - patient instructed to contact office with any additional concerns. ER/RTC precautions were reviewed with the patient. Follow-up plan: Return in about 4 weeks (around 05/06/2016) for BP RECHECK [place].

## 2016-04-09 LAB — COMPLETE METABOLIC PANEL WITH GFR
ALT: 77 U/L — ABNORMAL HIGH (ref 9–46)
AST: 84 U/L — ABNORMAL HIGH (ref 10–35)
Albumin: 4.7 g/dL (ref 3.6–5.1)
Alkaline Phosphatase: 97 U/L (ref 40–115)
BUN: 8 mg/dL (ref 7–25)
CO2: 22 mmol/L (ref 20–31)
Calcium: 9.6 mg/dL (ref 8.6–10.3)
Chloride: 101 mmol/L (ref 98–110)
Creat: 0.68 mg/dL — ABNORMAL LOW (ref 0.70–1.33)
GFR, Est African American: >89 mL/min (ref >=60)
GFR, Est Non African American: >89 mL/min (ref >=60)
Glucose, Bld: 84 mg/dL (ref 65–99)
Potassium: 4.5 mmol/L (ref 3.5–5.3)
Sodium: 132 mmol/L — ABNORMAL LOW (ref 135–146)
Total Bilirubin: 0.8 mg/dL (ref 0.2–1.2)
Total Protein: 7.2 g/dL (ref 6.1–8.1)

## 2016-04-10 DIAGNOSIS — R748 Abnormal levels of other serum enzymes: Secondary | ICD-10-CM | POA: Insufficient documentation

## 2016-04-10 DIAGNOSIS — E871 Hypo-osmolality and hyponatremia: Secondary | ICD-10-CM | POA: Insufficient documentation

## 2016-04-15 ENCOUNTER — Other Ambulatory Visit: Payer: Self-pay

## 2016-04-15 DIAGNOSIS — R932 Abnormal findings on diagnostic imaging of liver and biliary tract: Secondary | ICD-10-CM

## 2016-04-15 DIAGNOSIS — R748 Abnormal levels of other serum enzymes: Secondary | ICD-10-CM

## 2016-04-15 DIAGNOSIS — R7879 Finding of abnormal level of heavy metals in blood: Secondary | ICD-10-CM

## 2016-04-15 DIAGNOSIS — R790 Abnormal level of blood mineral: Secondary | ICD-10-CM

## 2016-04-22 LAB — IRON AND TIBC
%SAT: 67 % — ABNORMAL HIGH (ref 15–60)
Iron: 219 ug/dL — ABNORMAL HIGH (ref 50–180)
TIBC: 327 ug/dL (ref 250–425)
UIBC: 108 ug/dL — ABNORMAL LOW (ref 125–400)

## 2016-04-22 LAB — BASIC METABOLIC PANEL
BUN: 9 mg/dL (ref 7–25)
CO2: 29 mmol/L (ref 20–31)
Calcium: 9.2 mg/dL (ref 8.6–10.3)
Chloride: 103 mmol/L (ref 98–110)
Creat: 0.66 mg/dL — ABNORMAL LOW (ref 0.70–1.33)
Glucose, Bld: 110 mg/dL — ABNORMAL HIGH (ref 65–99)
Potassium: 4.4 mmol/L (ref 3.5–5.3)
Sodium: 138 mmol/L (ref 135–146)

## 2016-04-22 LAB — HEPATITIS B SURFACE ANTIGEN: Hepatitis B Surface Ag: NEGATIVE

## 2016-04-22 LAB — TSH: TSH: 1.05 mIU/L (ref 0.40–4.50)

## 2016-04-22 LAB — LIPID PANEL
Cholesterol: 184 mg/dL (ref <200)
HDL: 104 mg/dL (ref >40)
LDL Cholesterol: 70 mg/dL (ref <100)
Total CHOL/HDL Ratio: 1.8 Ratio (ref <5.0)
Triglycerides: 49 mg/dL (ref <150)
VLDL: 10 mg/dL (ref <30)

## 2016-04-22 LAB — HEPATITIS C ANTIBODY: HCV Ab: NEGATIVE

## 2016-04-22 LAB — SODIUM, URINE, RANDOM: Sodium, Ur: 135 mmol/L (ref 28–272)

## 2016-04-22 LAB — OSMOLALITY: Osmolality: 291 mOsm/kg (ref 278–305)

## 2016-04-22 LAB — OSMOLALITY, URINE: Osmolality, Ur: 618 mOsm/kg (ref 50–1200)

## 2016-04-29 ENCOUNTER — Ambulatory Visit (INDEPENDENT_AMBULATORY_CARE_PROVIDER_SITE_OTHER): Payer: BLUE CROSS/BLUE SHIELD | Admitting: Licensed Clinical Social Worker

## 2016-04-29 ENCOUNTER — Ambulatory Visit (INDEPENDENT_AMBULATORY_CARE_PROVIDER_SITE_OTHER): Payer: BLUE CROSS/BLUE SHIELD

## 2016-04-29 DIAGNOSIS — R748 Abnormal levels of other serum enzymes: Secondary | ICD-10-CM

## 2016-04-29 DIAGNOSIS — F40242 Fear of bridges: Secondary | ICD-10-CM | POA: Diagnosis not present

## 2016-04-29 NOTE — Progress Notes (Signed)
   THERAPIST PROGRESS NOTE  Session Time: 3:05pm-4:00pm  Participation Level: Active  Behavioral Response: CasualAlertEuthymic  Type of Therapy: Individual Therapy  Treatment Goals addressed: Reduce anxiety associated with driving  Interventions: Treatment planning, relaxation training    Suicidal/Homicidal: Denied both  Therapist Interventions: Collaborated with patient to develop his treatment plan.  Briefly described interventions he can expect as he participates in therapy. Reviewed information about fight or flight and how it is activated whenever a person thinks there could be a potential threat.  Emphasized that panic symptoms are not dangerous.     Taught a breathing exercise called the 4-7-8 Breath.  Had patient watch a video of someone demonstrating the technique.  Recommended practicing the exercise at least twice a day.     Summary:   Developed the following treatment goal: Yazan will report being able to drive on the highway successfully while experiencing a manageable level of anxiety.   Since seen at intake he has not driven on the highway.  He has been driving on other roads.  Sometimes goes well out of his way to avoid the highway or bridges. Indicated that he is open to using the interventions described. Expressed intentions of practicing the 4-7-8 breath each day.    Plan: Patient says he will not be able to schedule his next therapy appointment until after the holidays because he expects his work schedule to be too busy.  Diagnosis: Fear of bridges    Armandina Stammer 04/29/2016

## 2016-05-06 ENCOUNTER — Ambulatory Visit (INDEPENDENT_AMBULATORY_CARE_PROVIDER_SITE_OTHER): Payer: BLUE CROSS/BLUE SHIELD | Admitting: Osteopathic Medicine

## 2016-05-06 ENCOUNTER — Encounter: Payer: Self-pay | Admitting: Osteopathic Medicine

## 2016-05-06 ENCOUNTER — Encounter: Payer: Self-pay | Admitting: Physician Assistant

## 2016-05-06 VITALS — BP 145/85 | HR 73 | Ht 70.0 in | Wt 154.0 lb

## 2016-05-06 DIAGNOSIS — F411 Generalized anxiety disorder: Secondary | ICD-10-CM | POA: Diagnosis not present

## 2016-05-06 DIAGNOSIS — I10 Essential (primary) hypertension: Secondary | ICD-10-CM

## 2016-05-06 DIAGNOSIS — R748 Abnormal levels of other serum enzymes: Secondary | ICD-10-CM | POA: Diagnosis not present

## 2016-05-06 DIAGNOSIS — F410 Panic disorder [episodic paroxysmal anxiety] without agoraphobia: Secondary | ICD-10-CM | POA: Diagnosis not present

## 2016-05-06 MED ORDER — PROPRANOLOL HCL ER 160 MG PO CP24: 160.0000 mg | ORAL_CAPSULE | Freq: Every day | ORAL | 1 refills | Status: DC

## 2016-05-06 MED ORDER — BUPROPION HCL ER (XL) 150 MG PO TB24: 150.0000 mg | ORAL_TABLET | ORAL | 1 refills | Status: DC

## 2016-05-06 NOTE — Progress Notes (Signed)
HPI: Jeff Aguirre is a 55 y.o. male  who presents to [place] today, 05/06/16,  for chief complaint of:  Chief Complaint  Patient presents with  . Follow-up    BLOOD PRESSURE     Anxiety: Doing better going to counseling. Has noticed some increase in anxiety symptoms since had to discontinue the Lexapro. Most recent session worked on some breathing exercises and relaxation techniques. Had to stop Lexapro due to possible concern this may be exacerbating abnormal liver function tests, see below.  Elevated LFT: Associated with abnormal ultrasound and increased iron levels, planning to follow-up with GI. I'm less suspicious for SSRI having anything to do with it but given that the LFTs were steadily increasing fat best to discontinue this medication.  Hypertension: Improved on manual recheck but still not quite at goal. No chest pain, pressure, shortness of breath.    Past medical, surgical, social and family history reviewed: Patient Active Problem List   Diagnosis Date Noted  . Hyponatremia 04/10/2016  . Elevated liver enzymes 04/10/2016  . Essential hypertension 02/18/2016  . Anxiety state 02/18/2016  . Panic anxiety syndrome 02/18/2016  . Adenomatous polyps 02/23/2012  . Active smoker 10/17/2011  . URI 07/03/2008   Past Surgical History:  Procedure Laterality Date  . growth on testicle removed  2000's   benign   Social History  Substance Use Topics  . Smoking status: Current Every Day Smoker    Packs/day: 1.00    Years: 35.00    Types: Cigarettes  . Smokeless tobacco: Not on file  . Alcohol use Not on file   Family History  Problem Relation Age of Onset  . Diabetes Mother   . Hypertension Mother   . Cancer Mother     breast  . Diabetes Father   . Hypertension Father   . Heart disease Father      Current medication list and allergy/intolerance information reviewed:   Current Outpatient Prescriptions on File Prior to Visit   Medication Sig Dispense Refill  . propranolol ER (INDERAL LA) 120 MG 24 hr capsule Take 1 capsule (120 mg total) by mouth daily. 30 capsule 3   No current facility-administered medications on file prior to visit.    Allergies  Allergen Reactions  . Morphine     REACTION: itching      Review of Systems:  Constitutional: No recent illness  HEENT: No  headache, no vision change  Cardiac: No  chest pain, No  pressure, No palpitations  Respiratory:  No  shortness of breath. No  Cough  Gastrointestinal: No  abdominal pain, no change on bowel habits  Psychiatric: No  concerns with depression, +concerns with anxiety  Exam:  BP (!) 145/85   Pulse 73   Ht 5\' 10"  (1.778 m)   Wt 154 lb (69.9 kg)   BMI 22.10 kg/m   Constitutional: VS see above. General Appearance: alert, well-developed, well-nourished, NAD  Eyes: Normal lids and conjunctive, non-icteric sclera  Ears, Nose, Mouth, Throat: MMM, Normal external inspection ears/nares/mouth/lips/gums.  Neck: No masses, trachea midline.   Respiratory: Normal respiratory effort. no wheeze, no rhonchi, no rales  Cardiovascular: S1/S2 normal, no murmur, no rub/gallop auscultated. RRR.   Musculoskeletal: Gait normal. Symmetric and independent movement of all extremities  Neurological: Normal balance/coordination. No tremor.  Skin: warm, dry, intact.   Psychiatric: Normal judgment/insight. Normal mood and affect. Oriented x3.     ASSESSMENT/PLAN:   Panic anxiety syndrome - Continued counseling, propranolol for  anxiety/hypertension, had to discontinue Lexapro due to concern for elevated liver enzymes, trial Wellbutrin  Generalized anxiety disorder - Plan: buPROPion (WELLBUTRIN XL) 150 MG 24 hr tablet  Essential hypertension - Increased his propranolol for anxiety/hypertension treatment - Plan: propranolol ER (INDERAL LA) 160 MG SR capsule  Elevated liver enzymes - Upcoming appointment with GI.      Visit summary with  medication list and pertinent instructions was printed for patient to review. All questions at time of visit were answered - patient instructed to contact office with any additional concerns. ER/RTC precautions were reviewed with the patient. Follow-up plan: Return in about 4 weeks (around 06/03/2016) for [place].

## 2016-05-13 ENCOUNTER — Other Ambulatory Visit (INDEPENDENT_AMBULATORY_CARE_PROVIDER_SITE_OTHER): Payer: BLUE CROSS/BLUE SHIELD

## 2016-05-13 ENCOUNTER — Other Ambulatory Visit: Payer: Self-pay

## 2016-05-13 ENCOUNTER — Encounter: Payer: Self-pay | Admitting: Physician Assistant

## 2016-05-13 ENCOUNTER — Ambulatory Visit (INDEPENDENT_AMBULATORY_CARE_PROVIDER_SITE_OTHER): Payer: BLUE CROSS/BLUE SHIELD | Admitting: Physician Assistant

## 2016-05-13 VITALS — BP 138/80 | HR 80 | Ht 70.0 in | Wt 151.0 lb

## 2016-05-13 DIAGNOSIS — R7989 Other specified abnormal findings of blood chemistry: Secondary | ICD-10-CM | POA: Diagnosis not present

## 2016-05-13 DIAGNOSIS — K760 Fatty (change of) liver, not elsewhere classified: Secondary | ICD-10-CM | POA: Diagnosis not present

## 2016-05-13 DIAGNOSIS — Z86010 Personal history of colonic polyps: Secondary | ICD-10-CM | POA: Diagnosis not present

## 2016-05-13 DIAGNOSIS — R945 Abnormal results of liver function studies: Principal | ICD-10-CM

## 2016-05-13 LAB — IBC PANEL
Iron: 197 ug/dL — ABNORMAL HIGH (ref 42–165)
Saturation Ratios: 50.3 % — ABNORMAL HIGH (ref 20.0–50.0)
Transferrin: 280 mg/dL (ref 212.0–360.0)

## 2016-05-13 LAB — FERRITIN: Ferritin: 567.7 ng/mL — ABNORMAL HIGH (ref 22.0–322.0)

## 2016-05-13 MED ORDER — NA SULFATE-K SULFATE-MG SULF 17.5-3.13-1.6 GM/177ML PO SOLN: 1.0000 | ORAL | 0 refills | Status: AC

## 2016-05-13 NOTE — Progress Notes (Signed)
Chief Complaint: Elevated LFT's  HPI:  Mr. Jeff Aguirre is a 55 year old Caucasian male with a past medical history of depression and hypertension, who was referred to me by Emeterio Reeve, DO for a complaint of elevated liver enzymes. Independent review of outside records shows that patient previously followed in [place] with digestive health specialists, his last colonoscopy was 02/16/12 by Dr. Erlene Quan with findings of a 15 mm polyp in the ascending colon, 5-20 mm polyps in the transverse colon and a 6 mm polyp in the descending colon as well as internal hemorrhoids.  It was recommend patient have repeat colonoscopy in 1 year.   According to referring physician's notes, labs were completed 04/08/2016 which continued to show some elevation in liver enzymes with AST at 84 and ALT at 77, on 03/10/16 AST was 47 ALT was normal at 44, 03/06/16 showed an AST elevated at 49 and ALT elevated at 50. Recent iron studies showed an iron elevated at 219 and percent saturation increase at 67%. Patient had a negative hepatitis C antibody and hep B surface antigen on 04/21/16. Patient also had recent ultrasound on 04/29/2016 which showed an echogenic liver consistent with fatty infiltration and/or hepatocellular disease. No focal hepatic abnormality.   Today, the patient tells me that he has been feeling well, but went to his primary care provider on multiple occasions and was told he had elevated liver enzymes. He denies a history of herbal supplements or overuse of Tylenol, family history of liver disease, no previous IV drug use or family history of liver disease. He does admit to drinking a couple of beers a week, if that. He denies previous history of alcoholism.   Patient is also aware of his history of colon polyps and recommendations for repeat in one year after his last, which would've been 2014, but tells me he just "didn't follow up".      Patient denies fever, chills, blood in the stool, melena, change in  bowel habits, weight loss, fatigue, anorexia, nausea, vomiting, heartburn, reflux, abdominal pain or symptoms that awaken him at night.  Past Medical history: Depression Hypertension  Past Surgical History:  Procedure Laterality Date  . growth on testicle removed  2000's   benign    Current Outpatient Prescriptions  Medication Sig Dispense Refill  . buPROPion (WELLBUTRIN XL) 150 MG 24 hr tablet Take 1 tablet (150 mg total) by mouth every morning. 30 tablet 1  . propranolol ER (INDERAL LA) 160 MG SR capsule Take 1 capsule (160 mg total) by mouth daily. 30 capsule 1   No current facility-administered medications for this visit.     Allergies as of 05/13/2016 - Review Complete 05/06/2016  Allergen Reaction Noted  . Morphine      Family History  Problem Relation Age of Onset  . Diabetes Mother   . Hypertension Mother   . Cancer Mother     breast  . Diabetes Father   . Hypertension Father   . Heart disease Father     Social History   Social History  . Marital status: Married    Spouse name: N/A  . Number of children: N/A  . Years of education: N/A   Occupational History  . Not on file.   Social History Main Topics  . Smoking status: Current Every Day Smoker    Packs/day: 1.00    Years: 35.00    Types: Cigarettes  . Smokeless tobacco: Not on file  . Alcohol use Not on file  . Drug  use: No  . Sexual activity: Not on file   Other Topics Concern  . Not on file   Social History Narrative  . No narrative on file    Review of Systems:     Constitutional: No weight loss, fever, chills, weakness or fatigue HEENT: Eyes: No change in vision               Ears, Nose, Throat:  No change in hearing Skin: No rash or itching Cardiovascular: No chest pain Respiratory: No SOB  Gastrointestinal: See HPI and otherwise negative Neurological: No headache Musculoskeletal: No new muscle or joint pain Hematologic: No bleeding  Psychiatric: No history of depression or  anxiety   Physical Exam:  Vital signs: Pulse 80   Ht 5\' 10"  (1.778 m)   Wt 151 lb (68.5 kg)   BMI 21.67 kg/m    Constitutional: Pleasant Caucasian male appears to be in NAD, Well developed, Well nourished, alert and cooperative Head:  Normocephalic and atraumatic. Eyes:   PEERL, EOMI. No icterus. Conjunctiva pink. Ears:  Normal auditory acuity. Neck:  Supple Throat: Oral cavity and pharynx without inflammation, swelling or lesion.  Respiratory: Respirations even and unlabored. Lungs clear to auscultation bilaterally.   No wheezes, crackles, or rhonchi.  Cardiovascular: Normal S1, S2. No MRG. Regular rate and rhythm. No peripheral edema, cyanosis or pallor.  Gastrointestinal:  Soft, nondistended, nontender. No rebound or guarding. Normal bowel sounds. No appreciable masses or hepatomegaly. Rectal:  Not performed.  Msk:  Symmetrical without gross deformities. Without edema, no deformity or joint abnormality.  Neurologic:  Alert and  oriented x4;  grossly normal neurologically.  Skin:   Dry and intact without significant lesions or rashes. Psychiatric:  Demonstrates good judgement and reason without abnormal affect or behaviors.  RELEVANT LABS AND IMAGING: CBC    Component Value Date/Time   WBC 8.5 02/18/2016 1550   RBC 4.72 02/18/2016 1550   HGB 15.4 02/18/2016 1550   HCT 44.7 02/18/2016 1550   PLT 179 02/18/2016 1550   MCV 94.7 02/18/2016 1550   MCH 32.6 02/18/2016 1550   MCHC 34.5 02/18/2016 1550   RDW 13.9 02/18/2016 1550   LYMPHSABS 1,785 02/18/2016 1550   MONOABS 850 02/18/2016 1550   EOSABS 170 02/18/2016 1550   BASOSABS 85 02/18/2016 1550    CMP     Component Value Date/Time   NA 138 04/21/2016 1351   K 4.4 04/21/2016 1351   CL 103 04/21/2016 1351   CO2 29 04/21/2016 1351   GLUCOSE 110 (H) 04/21/2016 1351   BUN 9 04/21/2016 1351   CREATININE 0.66 (L) 04/21/2016 1351   CALCIUM 9.2 04/21/2016 1351   PROT 7.2 04/08/2016 1627   ALBUMIN 4.7 04/08/2016 1627    AST 84 (H) 04/08/2016 1627   ALT 77 (H) 04/08/2016 1627   ALKPHOS 97 04/08/2016 1627   BILITOT 0.8 04/08/2016 1627   GFRNONAA >89 04/08/2016 1627   GFRAA >89 04/08/2016 1627   US ABDOMEN LIMITED - RIGHT UPPER QUADRANT 04/29/16  COMPARISON:  No recent prior .  FINDINGS: Gallbladder:  No gallstones or wall thickening visualized. No sonographic Murphy sign noted by sonographer.  Common bile duct:  Diameter: 2.6 mm  Liver:  The liver is echogenic consistent fatty infiltration and/or hepatocellular disease. No focal hepatic abnormality identified.  IMPRESSION: 1. Liver is echogenic consistent with fatty infiltration and/or hepatocellular disease. No focal hepatic abnormality identified.  2. No acute or focal abnormality.   Electronically Signed   By: Marcello Moores  Register   On: 04/29/2016 15:02   Assessment: 1. Elevated LFTs: Patient's liver enzymes have been elevated over the past couple of months, hepatitis labs are done and negative, no obvious cause other than possibly fatty liver which was seen on recent ultrasound 04/29/2016, iron labs were also sligtty increased recently; consider autoimmune versus fatty liver versus other 2. History of colon polyps: Patient's last colonoscopy in 2013 Junior with findings of multiple colon polyps per report, recommendations were for repeat in one year, he never did this, we do not have pathology 3. Fatty liver: Could be cause of elevated liver enzymes, but will investigate further   Plan: 1. Discussedrecent imaging including finding of possible fatty liver, recommend a slow and steady weight loss of at least 1 pound per week, discussed that more than 1-2 pounds a week could cause worsening of fatty liver 2. Ordered liver serologies to include iron studies, ANA, AMA, ASMA, alpha-1 antitrypsin and ceruloplasmin 3. Scheduled patient for a screening colonoscopy with Dr. Silverio Decamp as she is the supervising physician this morning.  This will be in the [place]. Discussed risks, benefits, limitations and the patient agrees to proceed. 4. Patient to return to clinic per recommendations from Dr. Silverio Decamp after time of procedure.  Ellouise Newer, PA-C [facility] 05/13/2016, 1:11 PM  Cc: Emeterio Reeve, DO

## 2016-05-13 NOTE — Patient Instructions (Addendum)
You have been scheduled for a colonoscopy. Please follow written instructions given to you at your visit today.  Please pick up your prep supplies at the pharmacy within the next 1-3 days. If you use inhalers (even only as needed), please bring them with you on the day of your procedure. Your physician has requested that you go to www.[email or web address].com and enter the access code given to you at your visit today. This web site gives a general overview about your procedure. However, you should still follow specific instructions given to you by our office regarding your preparation for the procedure.  Your physician has requested that you go to the basement for lab work before leaving today.  

## 2016-05-13 NOTE — Progress Notes (Signed)
Reviewed and agree with documentation and assessment and plan. [name] , MD   

## 2016-05-14 LAB — MITOCHONDRIAL ANTIBODIES: Mitochondrial M2 Ab, IgG: <=20 Units (ref <=20.0)

## 2016-05-14 LAB — HEPATITIS A ANTIBODY, IGM: Hep A IgM: NONREACTIVE

## 2016-05-14 LAB — HEPATITIS B SURFACE ANTIBODY,QUALITATIVE: Hep B S Ab: NEGATIVE

## 2016-05-14 LAB — ANTI-NUCLEAR AB-TITER (ANA TITER): ANA Titer 1: 1:640 {titer} — ABNORMAL HIGH

## 2016-05-14 LAB — ANA: Anti Nuclear Antibody(ANA): POSITIVE — AB

## 2016-05-15 LAB — ANTI-SMOOTH MUSCLE ANTIBODY, IGG: Smooth Muscle Ab: <20 U (ref <20)

## 2016-05-15 LAB — CERULOPLASMIN: Ceruloplasmin: 27 mg/dL (ref 18–36)

## 2016-05-15 LAB — ALPHA-1-ANTITRYPSIN: A-1 Antitrypsin, Ser: 121 mg/dL (ref 83–199)

## 2016-05-30 ENCOUNTER — Other Ambulatory Visit: Payer: BLUE CROSS/BLUE SHIELD

## 2016-06-03 ENCOUNTER — Ambulatory Visit (HOSPITAL_COMMUNITY): Payer: Self-pay | Admitting: Licensed Clinical Social Worker

## 2016-06-03 ENCOUNTER — Ambulatory Visit: Payer: Self-pay | Admitting: Osteopathic Medicine

## 2016-06-03 LAB — HEMOCHROMATOSIS DNA-PCR(C282Y,H63D)

## 2016-06-04 ENCOUNTER — Other Ambulatory Visit: Payer: Self-pay

## 2016-06-17 ENCOUNTER — Encounter: Payer: Self-pay | Admitting: Gastroenterology

## 2016-06-17 ENCOUNTER — Ambulatory Visit (AMBULATORY_SURGERY_CENTER): Payer: BLUE CROSS/BLUE SHIELD | Admitting: Gastroenterology

## 2016-06-17 VITALS — BP 132/83 | HR 79 | Temp 99.1°F | Resp 13 | Ht 70.0 in | Wt 151.0 lb

## 2016-06-17 DIAGNOSIS — K621 Rectal polyp: Secondary | ICD-10-CM

## 2016-06-17 DIAGNOSIS — Z86010 Personal history of colonic polyps: Secondary | ICD-10-CM

## 2016-06-17 DIAGNOSIS — D124 Benign neoplasm of descending colon: Secondary | ICD-10-CM | POA: Diagnosis not present

## 2016-06-17 DIAGNOSIS — D128 Benign neoplasm of rectum: Secondary | ICD-10-CM

## 2016-06-17 DIAGNOSIS — D122 Benign neoplasm of ascending colon: Secondary | ICD-10-CM

## 2016-06-17 DIAGNOSIS — D125 Benign neoplasm of sigmoid colon: Secondary | ICD-10-CM

## 2016-06-17 DIAGNOSIS — D123 Benign neoplasm of transverse colon: Secondary | ICD-10-CM

## 2016-06-17 MED ORDER — SODIUM CHLORIDE 0.9 % IV SOLN: 500.0000 mL | INTRAVENOUS | Status: DC

## 2016-06-17 NOTE — Op Note (Signed)
[facility] Patient Name: Jeff Aguirre Procedure Date: 06/17/2016 2:55 PM MRN: [id number] Endoscopist: Mauri Pole , MD Age: 56 Referring MD:  Date of Birth: 03-Feb-1961 Gender: Male Account #: [id number] Procedure:                Colonoscopy Indications:              Surveillance: Personal history of adenomatous                            polyps on last colonoscopy 5 years ago, High risk                            colon cancer surveillance: Personal history of                            adenoma (10 mm or greater in size), High risk colon                            cancer surveillance: Personal history of multiple                            (3 or more) adenomas, Last colonoscopy: 2013 Medicines:                Monitored Anesthesia Care Procedure:                Pre-Anesthesia Assessment:                           - Prior to the procedure, a History and Physical                            was performed, and patient medications and                            allergies were reviewed. The patient's tolerance of                            previous anesthesia was also reviewed. The risks                            and benefits of the procedure and the sedation                            options and risks were discussed with the patient.                            All questions were answered, and informed consent                            was obtained. Prior Anticoagulants: The patient has                            taken no previous anticoagulant or antiplatelet  agents. ASA Grade Assessment: II - A patient with                            mild systemic disease. After reviewing the risks                            and benefits, the patient was deemed in                            satisfactory condition to undergo the procedure.                           After obtaining informed consent, the colonoscope                            was passed under  direct vision. Throughout the                            procedure, the patient's blood pressure, pulse, and                            oxygen saturations were monitored continuously. The                            Model CF-HQ190L [id number]) scope was introduced                            through the anus and advanced to the the terminal                            ileum, with identification of the appendiceal                            orifice and IC valve. The colonoscopy was performed                            without difficulty. The patient tolerated the                            procedure well. The quality of the bowel                            preparation was good. The terminal ileum, ileocecal                            valve, appendiceal orifice, and rectum were                            photographed. Scope In: 3:05:45 PM Scope Out: 3:23:08 PM Scope Withdrawal Time: 0 hours 13 minutes 31 seconds  Total Procedure Duration: 0 hours 17 minutes 23 seconds  Findings:                 The perianal and digital rectal examinations were  normal.                           Three sessile polyps were found in the rectum,                            sigmoid colon and descending colon. The polyps were                            3 to 6 mm in size. These polyps were removed with a                            cold biopsy forceps. Resection and retrieval were                            complete.                           A 2 mm polyp was found in the ascending colon. The                            polyp was sessile. The polyp was removed with a                            cold biopsy forceps. Resection and retrieval were                            complete.                           A 9 mm polyp was found in the transverse colon. The                            polyp was flat. The polyp was removed with a                            piecemeal technique using a cold snare.  Resection                            and retrieval were complete.                           A few small-mouthed diverticula were found in the                            sigmoid colon.                           Non-bleeding internal hemorrhoids were found during                            retroflexion. The hemorrhoids were small.                           The exam  was otherwise without abnormality. Complications:            No immediate complications. Estimated Blood Loss:     Estimated blood loss was minimal. Impression:               - Three 3 to 6 mm polyps in the rectum, in the                            sigmoid colon and in the descending colon, removed                            with a cold biopsy forceps. Resected and retrieved.                           - One 2 mm polyp in the ascending colon, removed                            with a cold biopsy forceps. Resected and retrieved.                           - One 9 mm polyp in the transverse colon, removed                            piecemeal using a cold snare. Resected and                            retrieved.                           - Diverticulosis in the sigmoid colon.                           - Non-bleeding internal hemorrhoids.                           - The examination was otherwise normal. Recommendation:           - Patient has a contact number available for                            emergencies. The signs and symptoms of potential                            delayed complications were discussed with the                            patient. Return to normal activities tomorrow.                            Written discharge instructions were provided to the                            patient.                           - Resume  previous diet today.                           - Continue present medications.                           - Await pathology results.                           - Repeat colonoscopy in 3 years for  surveillance                            based on pathology results. Mauri Pole, MD 06/17/2016 3:29:08 PM This report has been signed electronically.

## 2016-06-17 NOTE — Progress Notes (Signed)
Report to PACU, RN, vss, BBS= Clear.  

## 2016-06-17 NOTE — Progress Notes (Signed)
Called to room to assist during endoscopic procedure.  Patient ID and intended procedure confirmed with present staff. Received instructions for my participation in the procedure from the performing physician.  

## 2016-06-17 NOTE — Patient Instructions (Signed)
YOU HAD AN ENDOSCOPIC PROCEDURE TODAY AT [facility]:   Refer to the procedure report that was given to you for any specific questions about what was found during the examination.  If the procedure report does not answer your questions, please call your gastroenterologist to clarify.  If you requested that your care partner not be given the details of your procedure findings, then the procedure report has been included in a sealed envelope for you to review at your convenience later.  YOU SHOULD EXPECT: Some feelings of bloating in the abdomen. Passage of more gas than usual.  Walking can help get rid of the air that was put into your GI tract during the procedure and reduce the bloating. If you had a lower endoscopy (such as a colonoscopy or flexible sigmoidoscopy) you may notice spotting of blood in your stool or on the toilet paper. If you underwent a bowel prep for your procedure, you may not have a normal bowel movement for a few days.  Please Note:  You might notice some irritation and congestion in your nose or some drainage.  This is from the oxygen used during your procedure.  There is no need for concern and it should clear up in a day or so.  SYMPTOMS TO REPORT IMMEDIATELY:   Following lower endoscopy (colonoscopy or flexible sigmoidoscopy):  Excessive amounts of blood in the stool  Significant tenderness or worsening of abdominal pains  Swelling of the abdomen that is new, acute  Fever of 100F or higher   Following upper endoscopy (EGD)  Vomiting of blood or coffee ground material  New chest pain or pain under the shoulder blades  Painful or persistently difficult swallowing  New shortness of breath  Fever of 100F or higher  Black, tarry-looking stools  For urgent or emergent issues, a gastroenterologist can be reached at any hour by calling [phone].   DIET:  We do recommend a small meal at first, but then you may proceed to your regular diet.  Drink  plenty of fluids but you should avoid alcoholic beverages for 24 hours.  ACTIVITY:  You should plan to take it easy for the rest of today and you should NOT DRIVE or use heavy machinery until tomorrow (because of the sedation medicines used during the test).    FOLLOW UP: Our staff will call the number listed on your records the next business day following your procedure to check on you and address any questions or concerns that you may have regarding the information given to you following your procedure. If we do not reach you, we will leave a message.  However, if you are feeling well and you are not experiencing any problems, there is no need to return our call.  We will assume that you have returned to your regular daily activities without incident.  If any biopsies were taken you will be contacted by phone or by letter within the next 1-3 weeks.  Please call us at [phone] if you have not heard about the biopsies in 3 weeks.    SIGNATURES/CONFIDENTIALITY: You and/or your care partner have signed paperwork which will be entered into your electronic medical record.  These signatures attest to the fact that that the information above on your After Visit Summary has been reviewed and is understood.  Full responsibility of the confidentiality of this discharge information lies with you and/or your care-partner.    Handouts were given to your care partner on polyps,  diverticulosis, and hemorrhoids. You may resume your current medications today. Await biopsy results. Please call if any questions or concerns.   

## 2016-06-17 NOTE — Progress Notes (Signed)
No problems noted in the recovery room. maw 

## 2016-06-18 ENCOUNTER — Telehealth: Payer: Self-pay

## 2016-06-18 NOTE — Telephone Encounter (Signed)
  Follow up Call-  Call back number 06/17/2016  Post procedure Call Back phone  # [phone]  Permission to leave phone message Yes  Some recent data might be hidden     Patient questions:  Do you have a fever, pain , or abdominal swelling? No. Pain Score  0 *  Have you tolerated food without any problems? Yes.    Have you been able to return to your normal activities? Yes.    Do you have any questions about your discharge instructions: Diet   No. Medications  No. Follow up visit  No.  Do you have questions or concerns about your Care? No.  Actions: * If pain score is 4 or above: No action needed, pain <4.

## 2016-06-24 ENCOUNTER — Ambulatory Visit (INDEPENDENT_AMBULATORY_CARE_PROVIDER_SITE_OTHER): Payer: BLUE CROSS/BLUE SHIELD | Admitting: Osteopathic Medicine

## 2016-06-24 ENCOUNTER — Encounter: Payer: Self-pay | Admitting: Osteopathic Medicine

## 2016-06-24 VITALS — BP 163/100 | HR 78 | Ht 70.0 in | Wt 154.0 lb

## 2016-06-24 DIAGNOSIS — I10 Essential (primary) hypertension: Secondary | ICD-10-CM

## 2016-06-24 DIAGNOSIS — F411 Generalized anxiety disorder: Secondary | ICD-10-CM

## 2016-06-24 DIAGNOSIS — R748 Abnormal levels of other serum enzymes: Secondary | ICD-10-CM

## 2016-06-24 MED ORDER — ESCITALOPRAM OXALATE 20 MG PO TABS: 20.0000 mg | ORAL_TABLET | Freq: Every day | ORAL | 3 refills | Status: DC

## 2016-06-24 NOTE — Progress Notes (Signed)
HPI: Jeff Aguirre is a 56 y.o. male  who presents to [facility] today, 06/24/16,  for chief complaint of:  Chief Complaint  Patient presents with  . Follow-up    BLOOD PRESSURE     Anxiety: Has noticed some increase in anxiety symptoms since had to discontinue the Lexapro due to concern for possibility that this may have been affecting his LFTs so we started him on Wellbutrin and Wellbutrin has not really helped. Most recent therapy session worked on some breathing exercises and relaxation techniques that he has not been able to continue counseling due to cost, states it is about USD300 per session every time he goes.  Elevated LFT: Associated with abnormal ultrasound and increased iron levels, he is currently following with GI. And has an upcoming appointment to discuss monitoring of labs and if these are increasing possible liver biopsy.  Hypertension: Improved on manual recheck but still not quite at goal. No chest pain, pressure, shortness of breath. Blood pressure at GI office is much better. Patient states that he gets quite stressed out driving here, driving is one of his anxieties/phobias.    Past medical history, surgical history, social history and family history reviewed.  Patient Active Problem List   Diagnosis Date Noted  . Generalized anxiety disorder 05/06/2016  . Hyponatremia 04/10/2016  . Elevated liver enzymes 04/10/2016  . Essential hypertension 02/18/2016  . Anxiety state 02/18/2016  . Panic anxiety syndrome 02/18/2016  . Adenomatous polyps 02/23/2012  . Active smoker 10/17/2011  . URI 07/03/2008    Current medication list and allergy/intolerance information reviewed.   Current Outpatient Prescriptions on File Prior to Visit  Medication Sig Dispense Refill  . buPROPion (WELLBUTRIN XL) 150 MG 24 hr tablet Take 1 tablet (150 mg total) by mouth every morning. 30 tablet 1  . propranolol ER (INDERAL LA) 160 MG SR capsule Take  1 capsule (160 mg total) by mouth daily. 30 capsule 1   Current Facility-Administered Medications on File Prior to Visit  Medication Dose Route Frequency Provider Last Rate Last Dose  . 0.9 %  sodium chloride infusion  500 mL Intravenous Continuous Mauri Pole, MD       Allergies  Allergen Reactions  . Morphine Itching    REACTION: itching      Review of Systems:  Constitutional: No recent illness  HEENT: No  headache, no vision change  Cardiac: No  chest pain, No  pressure, No palpitations  Respiratory:  No  shortness of breath. No  Cough  Gastrointestinal: No  abdominal pain, no change on bowel habits  Musculoskeletal: No new myalgia/arthralgia  Psychiatric: No  concerns with depression, +concerns with anxiety  Exam:  BP (!) 163/100   Pulse 78   Ht 5\' 10"  (1.778 m)   Wt 154 lb (69.9 kg)   BMI 22.10 kg/m   Constitutional: VS see above. General Appearance: alert, well-developed, well-nourished, NAD  Eyes: Normal lids and conjunctive, non-icteric sclera  Ears, Nose, Mouth, Throat: MMM, Normal external inspection ears/nares/mouth/lips/gums.  Neck: No masses, trachea midline.   Respiratory: Normal respiratory effort. no wheeze, no rhonchi, no rales  Cardiovascular: S1/S2 normal, no murmur, no rub/gallop auscultated. RRR.   Musculoskeletal: Gait normal. Symmetric and independent movement of all extremities  Neurological: Normal balance/coordination. No tremor.  Skin: warm, dry, intact.   Psychiatric: Normal judgment/insight. Normal mood and affect. Oriented x3.      ASSESSMENT/PLAN:   Anxiety state - Trial SSRI + Wellbutrin and  see if this helps overall. Cannot afford additional counseling sessions.  - Plan: escitalopram (LEXAPRO) 20 MG tablet  Essential hypertension - Likely component of whitecoat syndrome/exacerbation due to anxiety/phobia. Blood pressure okay at other offices where he doesn't have to drive. No change rx now  Elevated liver  enzymes      Follow-up plan: Return in about 6 weeks (around 08/05/2016) for ANXIETY FOLLOW-UP, sooner if needed.  Visit summary with medication list and pertinent instructions was printed for patient to review, alert [place] if any changes needed. All questions at time of visit were answered - patient instructed to contact office with any additional concerns. ER/RTC precautions were reviewed with the patient and understanding verbalized.

## 2016-06-30 ENCOUNTER — Encounter: Payer: Self-pay | Admitting: Gastroenterology

## 2016-07-02 ENCOUNTER — Other Ambulatory Visit: Payer: Self-pay | Admitting: Osteopathic Medicine

## 2016-07-02 DIAGNOSIS — I10 Essential (primary) hypertension: Secondary | ICD-10-CM

## 2016-07-04 ENCOUNTER — Ambulatory Visit: Payer: Self-pay | Admitting: Gastroenterology

## 2016-07-28 ENCOUNTER — Ambulatory Visit (INDEPENDENT_AMBULATORY_CARE_PROVIDER_SITE_OTHER): Payer: BLUE CROSS/BLUE SHIELD | Admitting: Osteopathic Medicine

## 2016-07-28 ENCOUNTER — Encounter: Payer: Self-pay | Admitting: Osteopathic Medicine

## 2016-07-28 VITALS — BP 166/107 | HR 98 | Ht 70.0 in | Wt 152.0 lb

## 2016-07-28 DIAGNOSIS — I10 Essential (primary) hypertension: Secondary | ICD-10-CM | POA: Diagnosis not present

## 2016-07-28 DIAGNOSIS — F411 Generalized anxiety disorder: Secondary | ICD-10-CM | POA: Diagnosis not present

## 2016-07-28 DIAGNOSIS — R945 Abnormal results of liver function studies: Secondary | ICD-10-CM

## 2016-07-28 DIAGNOSIS — R7989 Other specified abnormal findings of blood chemistry: Secondary | ICD-10-CM

## 2016-07-28 MED ORDER — ESCITALOPRAM OXALATE 20 MG PO TABS: 20.0000 mg | ORAL_TABLET | Freq: Every day | ORAL | 1 refills | Status: DC

## 2016-07-28 NOTE — Progress Notes (Signed)
HPI: Jeff Aguirre is a 56 y.o. male  who presents to [facility] today, 07/28/16,  for chief complaint of:  Chief Complaint  Patient presents with  . Follow-up    ANXIETY     Anxiety: SSRI + Wellbutrin trial as of last visit but has been out of the Wellbutrin 3 weeks. He states his anxiety is doing totally better, he is driving normally and would like to go back to work without restrictions.   Elevated LFT: Associated with abnormal ultrasound and increased iron levels, he is currently following with GI. At last visit per records review, had an upcoming appointment to discuss monitoring of labs and if these are increasing possible liver biopsy. Looks like he missed a visit 06/2016.   Hypertension: No chest pain, pressure, shortness of breath. Blood pressure at GI office is much better. Blood pressure was high at last visit but we didn't worry too much about this since he has to drive pretty far to get here and driving was when of his phobias/anxiety so now the patient states he can do this without any difficulty/stress.    Past medical history, surgical history, social history and family history reviewed.  Patient Active Problem List   Diagnosis Date Noted  . Generalized anxiety disorder 05/06/2016  . Hyponatremia 04/10/2016  . Elevated liver enzymes 04/10/2016  . Essential hypertension 02/18/2016  . Anxiety state 02/18/2016  . Panic anxiety syndrome 02/18/2016  . Adenomatous polyps 02/23/2012  . Active smoker 10/17/2011  . URI 07/03/2008    Current medication list and allergy/intolerance information reviewed.   Current Outpatient Prescriptions on File Prior to Visit  Medication Sig Dispense Refill  . buPROPion (WELLBUTRIN XL) 150 MG 24 hr tablet Take 1 tablet (150 mg total) by mouth every morning. 30 tablet 1  . escitalopram (LEXAPRO) 20 MG tablet Take 1 tablet (20 mg total) by mouth daily. 30 tablet 3  . propranolol ER (INDERAL LA) 160 MG  SR capsule take 1 capsule by mouth once daily 30 capsule 1   Current Facility-Administered Medications on File Prior to Visit  Medication Dose Route Frequency Provider Last Rate Last Dose  . 0.9 %  sodium chloride infusion  500 mL Intravenous Continuous Mauri Pole, MD       Allergies  Allergen Reactions  . Morphine Itching    REACTION: itching      Review of Systems:  Constitutional: No recent illness  HEENT: No  headache, no vision change  Cardiac: No  chest pain, No  pressure, No palpitations  Respiratory:  No  shortness of breath. No  Cough  Gastrointestinal: No  abdominal pain  Psychiatric: No  concerns with depression, +concerns with anxiety but much better  Exam:  BP (!) 166/107   Pulse 98   Ht 5\' 10"  (1.778 m)   Wt 152 lb (68.9 kg)   BMI 21.81 kg/m   Constitutional: VS see above. General Appearance: alert, well-developed, well-nourished, NAD  Eyes: Normal lids and conjunctive, non-icteric sclera  Ears, Nose, Mouth, Throat: MMM, Normal external inspection ears/nares/mouth/lips/gums.  Neck: No masses, trachea midline.   Respiratory: Normal respiratory effort. no wheeze, no rhonchi, no rales  Cardiovascular: S1/S2 normal, no murmur, no rub/gallop auscultated. RRR.   Musculoskeletal: Gait normal. Symmetric and independent movement of all extremities  Neurological: Normal balance/coordination. No tremor.  Skin: warm, dry, intact.   Psychiatric: Normal judgment/insight. Normal mood and affect. Oriented x3.      ASSESSMENT/PLAN: If BP still  high at nurse visit, we'll need to ask some adjustments to the antihypertensive regimen. Can also go ahead and repeat LFTs in the next 2 weeks. There was some concern that SSRI may have been contributing to hepatic dysfunction but this seems to have been ruled out per GI though we'll of course continue to monitor.  Anxiety state - Doing well on SSRI as of 07/2016, driving again. Cannot afford additional  counseling sessions. Would like to go back to work without restrictions.  - Plan: escitalopram (LEXAPRO) 20 MG tablet  Elevated LFTs - Plan: Hepatic function panel  Essential hypertension      Follow-up plan: Return in about 2 weeks (around 08/11/2016) for nurse visit BP check .  Visit summary with medication list and pertinent instructions was printed for patient to review, alert [place] if any changes needed. All questions at time of visit were answered - patient instructed to contact office with any additional concerns. ER/RTC precautions were reviewed with the patient and understanding verbalized.

## 2016-08-12 ENCOUNTER — Ambulatory Visit: Payer: Self-pay

## 2016-08-19 ENCOUNTER — Ambulatory Visit: Payer: BLUE CROSS/BLUE SHIELD

## 2016-08-26 ENCOUNTER — Ambulatory Visit: Payer: BLUE CROSS/BLUE SHIELD

## 2016-09-05 ENCOUNTER — Ambulatory Visit: Payer: BLUE CROSS/BLUE SHIELD

## 2016-09-05 ENCOUNTER — Other Ambulatory Visit: Payer: Self-pay | Admitting: Osteopathic Medicine

## 2016-09-05 DIAGNOSIS — I10 Essential (primary) hypertension: Secondary | ICD-10-CM

## 2016-09-12 ENCOUNTER — Ambulatory Visit: Payer: BLUE CROSS/BLUE SHIELD

## 2016-09-19 ENCOUNTER — Ambulatory Visit: Payer: BLUE CROSS/BLUE SHIELD

## 2016-11-07 ENCOUNTER — Other Ambulatory Visit: Payer: Self-pay | Admitting: Osteopathic Medicine

## 2016-11-07 DIAGNOSIS — I10 Essential (primary) hypertension: Secondary | ICD-10-CM

## 2016-12-03 ENCOUNTER — Telehealth: Payer: Self-pay

## 2016-12-03 NOTE — Telephone Encounter (Signed)
DPR on file. Left a message advising him it is time for follow up labs. Iron studies.

## 2016-12-07 ENCOUNTER — Other Ambulatory Visit: Payer: Self-pay | Admitting: Osteopathic Medicine

## 2016-12-07 DIAGNOSIS — I10 Essential (primary) hypertension: Secondary | ICD-10-CM

## 2017-01-08 ENCOUNTER — Other Ambulatory Visit: Payer: Self-pay | Admitting: Osteopathic Medicine

## 2017-01-08 DIAGNOSIS — I10 Essential (primary) hypertension: Secondary | ICD-10-CM

## 2017-02-12 ENCOUNTER — Other Ambulatory Visit: Payer: Self-pay | Admitting: Osteopathic Medicine

## 2017-02-12 DIAGNOSIS — I10 Essential (primary) hypertension: Secondary | ICD-10-CM

## 2017-02-12 DIAGNOSIS — F411 Generalized anxiety disorder: Secondary | ICD-10-CM

## 2017-03-24 ENCOUNTER — Other Ambulatory Visit: Payer: Self-pay | Admitting: Osteopathic Medicine

## 2017-03-24 DIAGNOSIS — I10 Essential (primary) hypertension: Secondary | ICD-10-CM

## 2017-04-28 ENCOUNTER — Other Ambulatory Visit: Payer: Self-pay | Admitting: Osteopathic Medicine

## 2017-04-28 DIAGNOSIS — I10 Essential (primary) hypertension: Secondary | ICD-10-CM

## 2017-05-29 ENCOUNTER — Other Ambulatory Visit: Payer: Self-pay | Admitting: Osteopathic Medicine

## 2017-05-29 DIAGNOSIS — I10 Essential (primary) hypertension: Secondary | ICD-10-CM

## 2017-06-12 ENCOUNTER — Other Ambulatory Visit: Payer: Self-pay | Admitting: Osteopathic Medicine

## 2017-06-12 DIAGNOSIS — I10 Essential (primary) hypertension: Secondary | ICD-10-CM

## 2017-06-29 ENCOUNTER — Other Ambulatory Visit: Payer: Self-pay | Admitting: Osteopathic Medicine

## 2017-06-29 DIAGNOSIS — I10 Essential (primary) hypertension: Secondary | ICD-10-CM

## 2017-06-30 ENCOUNTER — Telehealth: Payer: Self-pay | Admitting: Osteopathic Medicine

## 2017-06-30 ENCOUNTER — Other Ambulatory Visit: Payer: Self-pay

## 2017-06-30 DIAGNOSIS — I10 Essential (primary) hypertension: Secondary | ICD-10-CM

## 2017-06-30 MED ORDER — PROPRANOLOL HCL ER 160 MG PO CP24: ORAL_CAPSULE | ORAL | 0 refills | Status: DC

## 2017-06-30 NOTE — Telephone Encounter (Signed)
Pt called and stated he is completely out of his propranolol and would like to know is he can get enough sent to his pharmacy ( [facility] [address] ) until his appointment Monday feb. 11th. Thanks

## 2017-06-30 NOTE — Telephone Encounter (Signed)
Propranolol rx was renewed for 7 days, sent to pharmacy on file. Pt must keep f/u appt w/PCP for further refills.

## 2017-07-01 NOTE — Telephone Encounter (Signed)
Pt advised.

## 2017-07-06 ENCOUNTER — Encounter: Payer: Self-pay | Admitting: Osteopathic Medicine

## 2017-07-06 ENCOUNTER — Ambulatory Visit (INDEPENDENT_AMBULATORY_CARE_PROVIDER_SITE_OTHER): Payer: BLUE CROSS/BLUE SHIELD | Admitting: Osteopathic Medicine

## 2017-07-06 VITALS — BP 174/107 | HR 73 | Temp 98.1°F | Wt 159.0 lb

## 2017-07-06 DIAGNOSIS — R945 Abnormal results of liver function studies: Secondary | ICD-10-CM

## 2017-07-06 DIAGNOSIS — Z23 Encounter for immunization: Secondary | ICD-10-CM | POA: Diagnosis not present

## 2017-07-06 DIAGNOSIS — I10 Essential (primary) hypertension: Secondary | ICD-10-CM | POA: Diagnosis not present

## 2017-07-06 DIAGNOSIS — R7989 Other specified abnormal findings of blood chemistry: Secondary | ICD-10-CM

## 2017-07-06 NOTE — Progress Notes (Signed)
HPI: Jeff Aguirre is a 57 y.o. male who  has a past medical history of Anxiety, Colon polyps, and Hypertension.  he presents to [facility] today, 07/06/17,  for chief complaint of: Blood pressure follow-up  Recheck BP - last seen 07/2016, he is overdue for recheck (here and with GI to repeat labs). No symptoms - denies CP, SOB, tightness, dizziness, headache, vision changes. Has been taking his propranolol   Anxiety is under good control    Past medical, surgical, social and family history reviewed:  Patient Active Problem List   Diagnosis Date Noted  . Generalized anxiety disorder 05/06/2016  . Hyponatremia 04/10/2016  . Elevated liver enzymes 04/10/2016  . Essential hypertension 02/18/2016  . Anxiety state 02/18/2016  . Panic anxiety syndrome 02/18/2016  . Adenomatous polyps 02/23/2012  . Active smoker 10/17/2011  . URI 07/03/2008    Past Surgical History:  Procedure Laterality Date  . growth on testicle removed  2000's   benign  . LUNG SURGERY      Social History   Tobacco Use  . Smoking status: Current Every Day Smoker    Packs/day: 0.50    Years: 35.00    Pack years: 17.50    Types: Cigarettes  . Smokeless tobacco: Former Systems developer    Types: Chew    Quit date: 05/26/1976  Substance Use Topics  . Alcohol use: Yes    Alcohol/week: 7.2 - 10.8 oz    Types: 12 - 18 Cans of beer per week    Family History  Problem Relation Age of Onset  . Diabetes Mother   . Hypertension Mother   . Cancer Mother        breast  . Diabetes Father   . Hypertension Father   . Heart disease Father   . Diabetes Sister   . Hypertension Sister   . Diabetes Brother   . Hypertension Brother      Current medication list and allergy/intolerance information reviewed:    Current Outpatient Medications  Medication Sig Dispense Refill  . escitalopram (LEXAPRO) 20 MG tablet take 1 tablet by mouth once daily 90 tablet 1  . propranolol ER (INDERAL  LA) 160 MG SR capsule take 1 capsule by mouth once daily (Pt needs to keep f/u appt for refills) 7 capsule 0   Current Facility-Administered Medications  Medication Dose Route Frequency Provider Last Rate Last Dose  . 0.9 %  sodium chloride infusion  500 mL Intravenous Continuous Nandigam, Venia Minks, MD        Allergies  Allergen Reactions  . Morphine Itching    REACTION: itching      Review of Systems:  Constitutional:  No  fever, no chills, No recent illness, No unintentional weight changes. No significant fatigue.   HEENT: No  headache, no vision change  Cardiac: No  chest pain, No  pressure, No palpitations, No  Orthopnea  Respiratory:  No  shortness of breath. No  Cough  Gastrointestinal: No  abdominal pain, No  nausea  Neurologic: No  weakness, No  dizziness  Psychiatric: No  concerns with depression, No  concerns with anxiety, No sleep problems, No mood problems  Exam:  BP (!) 174/107 (BP Location: Left Arm)   Pulse 73   Temp 98.1 F (36.7 C) (Oral)   Wt 159 lb 0.6 oz (72.1 kg)   BMI 22.82 kg/m   Constitutional: VS see above. General Appearance: alert, well-developed, well-nourished, NAD  Eyes: Normal lids and conjunctive,  non-icteric sclera  Ears, Nose, Mouth, Throat: MMM, Normal external inspection ears/nares/mouth/lips/gums.   Neck: No masses, trachea midline.  Respiratory: Normal respiratory effort. no wheeze, no rhonchi, no rales  Cardiovascular: S1/S2 normal, no murmur, no rub/gallop auscultated. RRR. No lower extremity edema.  Musculoskeletal: Gait normal.   Neurological: Normal balance/coordination. No tremor.   Skin: warm, dry, intact  Psychiatric: Normal judgment/insight. Normal mood and affect. Oriented x3.   Depression screen [facility] 2/9 07/06/2017 07/28/2016  Decreased Interest 0 0  Down, Depressed, Hopeless 0 0  PHQ - 2 Score 0 0  Altered sleeping 0 0  Tired, decreased energy 0 0  Change in appetite 0 0  Feeling bad or failure about  yourself  0 0  Trouble concentrating 0 0  Moving slowly or fidgety/restless 0 0  Suicidal thoughts 0 0  PHQ-9 Score 0 0  Some encounter information is confidential and restricted. Go to Review Flowsheets activity to see all data.   GAD 7 : Generalized Anxiety Score 07/06/2017 07/28/2016 05/06/2016 03/06/2016  Nervous, Anxious, on Edge 0 0 1 3  Control/stop worrying 0 0 1 3  Worry too much - different things 0 0 2 3  Trouble relaxing 0 0 1 3  Restless 0 0 1 3  Easily annoyed or irritable 0 0 1 2  Afraid - awful might happen 0 0 1 3  Total GAD 7 Score 0 0 8 20  Anxiety Difficulty - - Somewhat difficult -       ASSESSMENT/PLAN:   Essential hypertension - He would like to hold off on blood pressure medication adjustment - risk/benefit discussed. Emphasized the importance of follow-up with home blood pressure cuff - Plan: CBC, COMPLETE METABOLIC PANEL WITH GFR, Lipid panel, TSH  Need for influenza vaccination - Plan: Flu Vaccine QUAD 6+ mos PF IM (Fluarix Quad PF)  Elevated LFTs - Plan: COMPLETE METABOLIC PANEL WITH GFR    Patient Instructions   Plan to return for nurse visit to verify home blood pressure cuff. In the meantime, be keeping a record of you rblood pressures at home and bring this with you to the visit with the nurse.   If your cuff is measuring within 5-10 points of ours AND your home numbers are less than 140/90 (ideally less than 130/80) then nothing else to do.   If your home blood pressure cuff is inaccurate or is accurate but measuring above goal, we will need to talk about adjusting your medications.        Visit summary with medication list and pertinent instructions was printed for patient to review. All questions at time of visit were answered - patient instructed to contact office with any additional concerns. ER/RTC precautions were reviewed with the patient.   Follow-up plan: Return for Recheck blood pressure within one week with a nurse visit. See  patient instructions.  Note: Total time spent 25 minutes, greater than 50% of the visit was spent face-to-face counseling and coordinating care for the following: The primary encounter diagnosis was Essential hypertension. Diagnoses of Need for influenza vaccination and Elevated LFTs were also pertinent to this visit.Marland Kitchen  Please note: voice recognition software was used to produce this document, and typos may escape review. Please contact Dr. Sheppard Coil for any needed clarifications.

## 2017-07-06 NOTE — Patient Instructions (Signed)
   Plan to return for nurse visit to verify home blood pressure cuff. In the meantime, be keeping a record of you rblood pressures at home and bring this with you to the visit with the nurse.   If your cuff is measuring within 5-10 points of ours AND your home numbers are less than 140/90 (ideally less than 130/80) then nothing else to do.   If your home blood pressure cuff is inaccurate or is accurate but measuring above goal, we will need to talk about adjusting your medications.    

## 2017-07-07 ENCOUNTER — Other Ambulatory Visit: Payer: Self-pay | Admitting: Osteopathic Medicine

## 2017-07-07 DIAGNOSIS — I10 Essential (primary) hypertension: Secondary | ICD-10-CM

## 2017-07-07 LAB — COMPLETE METABOLIC PANEL WITH GFR
AG Ratio: 1.8 (calc) (ref 1.0–2.5)
ALT: 89 U/L — ABNORMAL HIGH (ref 9–46)
AST: 79 U/L — ABNORMAL HIGH (ref 10–35)
Albumin: 4.7 g/dL (ref 3.6–5.1)
Alkaline phosphatase (APISO): 108 U/L (ref 40–115)
BUN/Creatinine Ratio: 12 (calc) (ref 6–22)
BUN: 8 mg/dL (ref 7–25)
CO2: 29 mmol/L (ref 20–32)
Calcium: 10 mg/dL (ref 8.6–10.3)
Chloride: 97 mmol/L — ABNORMAL LOW (ref 98–110)
Creat: 0.65 mg/dL — ABNORMAL LOW (ref 0.70–1.33)
GFR, Est African American: 124 mL/min/{1.73_m2} (ref >=60)
GFR, Est Non African American: 107 mL/min/{1.73_m2} (ref >=60)
Globulin: 2.6 g/dL (calc) (ref 1.9–3.7)
Glucose, Bld: 86 mg/dL (ref 65–99)
Potassium: 4.3 mmol/L (ref 3.5–5.3)
Sodium: 134 mmol/L — ABNORMAL LOW (ref 135–146)
Total Bilirubin: 0.7 mg/dL (ref 0.2–1.2)
Total Protein: 7.3 g/dL (ref 6.1–8.1)

## 2017-07-07 LAB — CBC
HCT: 43.8 % (ref 38.5–50.0)
Hemoglobin: 15.4 g/dL (ref 13.2–17.1)
MCH: 33.4 pg — ABNORMAL HIGH (ref 27.0–33.0)
MCHC: 35.2 g/dL (ref 32.0–36.0)
MCV: 95 fL (ref 80.0–100.0)
MPV: 10.7 fL (ref 7.5–12.5)
Platelets: 181 10*3/uL (ref 140–400)
RBC: 4.61 10*6/uL (ref 4.20–5.80)
RDW: 12.3 % (ref 11.0–15.0)
WBC: 7.8 10*3/uL (ref 3.8–10.8)

## 2017-07-07 LAB — LIPID PANEL
Cholesterol: 183 mg/dL (ref <200)
HDL: 109 mg/dL (ref >40)
LDL Cholesterol (Calc): 59 mg/dL (calc)
Non-HDL Cholesterol (Calc): 74 mg/dL (calc) (ref <130)
Total CHOL/HDL Ratio: 1.7 (calc) (ref <5.0)
Triglycerides: 73 mg/dL (ref <150)

## 2017-07-07 LAB — TSH: TSH: 2.1 mIU/L (ref 0.40–4.50)

## 2017-07-13 ENCOUNTER — Ambulatory Visit (INDEPENDENT_AMBULATORY_CARE_PROVIDER_SITE_OTHER): Payer: BLUE CROSS/BLUE SHIELD | Admitting: Osteopathic Medicine

## 2017-07-13 VITALS — BP 171/101 | HR 76

## 2017-07-13 DIAGNOSIS — I10 Essential (primary) hypertension: Secondary | ICD-10-CM

## 2017-07-13 NOTE — Progress Notes (Signed)
   Subjective:    Patient ID: Jeff Aguirre, male    DOB: [birth year], 59 y.o.   MRN: [id number]  HPI  Patient here for BP evaluation and comparison to his home device. He smoked cigarette 90 minutes prior to visit. Taking rx as prescribed. Denies chest pain, shortness of breath; headaches or any other discomfort.    Review of Systems     Objective:   Physical Exam Home BPs: 07/07/17/@ 0630  =150/96 07/08/17 @1130    = 157/93 07/09/17/@ 1630  =153/94 07/10/17 @ 0800 = 119/77 07/12/17 @ 1300  =150/94 07/13/17 @ 0800  =145/99       Assessment & Plan:  Dr.Alexander will review and advise.

## 2017-07-14 NOTE — Progress Notes (Signed)
BP (!) 171/101 Comment: home device  Pulse 76 Comment: home device  172/90 ours 888/757 his  Diastolic higher on his but systolic looks accurate. Since most of his home numbers are over 150, when we'd like these to be no higher than 140 and ideally would like them to be 130, I'm going to adjust BP meds: adding amlodpine 5 mg daily and plan to recheck w/ Dr Sheppard Coil in 2 weeks    (hx abn electrolytes Na and K and liver enz)

## 2017-07-15 ENCOUNTER — Encounter: Payer: Self-pay | Admitting: Osteopathic Medicine

## 2017-07-15 MED ORDER — AMLODIPINE BESYLATE 5 MG PO TABS: 5.0000 mg | ORAL_TABLET | Freq: Every day | ORAL | 1 refills | Status: DC

## 2017-07-15 NOTE — Progress Notes (Signed)
Pt has been informed of provider's note & amlodipine medication. Verbalized understanding, no other inquiries. Pt was transferred to [facility] to make f/u appt w/pcp in 2 wks.

## 2017-08-03 ENCOUNTER — Encounter: Payer: Self-pay | Admitting: Osteopathic Medicine

## 2017-08-03 ENCOUNTER — Ambulatory Visit (INDEPENDENT_AMBULATORY_CARE_PROVIDER_SITE_OTHER): Payer: BLUE CROSS/BLUE SHIELD | Admitting: Osteopathic Medicine

## 2017-08-03 VITALS — BP 172/90 | HR 72 | Temp 98.4°F | Wt 156.1 lb

## 2017-08-03 DIAGNOSIS — I10 Essential (primary) hypertension: Secondary | ICD-10-CM | POA: Diagnosis not present

## 2017-08-03 DIAGNOSIS — R748 Abnormal levels of other serum enzymes: Secondary | ICD-10-CM | POA: Diagnosis not present

## 2017-08-03 DIAGNOSIS — E871 Hypo-osmolality and hyponatremia: Secondary | ICD-10-CM | POA: Diagnosis not present

## 2017-08-03 LAB — COMPLETE METABOLIC PANEL WITH GFR
AG Ratio: 1.7 (calc) (ref 1.0–2.5)
ALT: 83 U/L — ABNORMAL HIGH (ref 9–46)
AST: 78 U/L — ABNORMAL HIGH (ref 10–35)
Albumin: 4.7 g/dL (ref 3.6–5.1)
Alkaline phosphatase (APISO): 115 U/L (ref 40–115)
BUN/Creatinine Ratio: 9 (calc) (ref 6–22)
BUN: 6 mg/dL — ABNORMAL LOW (ref 7–25)
CO2: 28 mmol/L (ref 20–32)
Calcium: 10 mg/dL (ref 8.6–10.3)
Chloride: 99 mmol/L (ref 98–110)
Creat: 0.64 mg/dL — ABNORMAL LOW (ref 0.70–1.33)
GFR, Est African American: 124 mL/min/{1.73_m2} (ref >=60)
GFR, Est Non African American: 107 mL/min/{1.73_m2} (ref >=60)
Globulin: 2.8 g/dL (calc) (ref 1.9–3.7)
Glucose, Bld: 86 mg/dL (ref 65–99)
Potassium: 4.6 mmol/L (ref 3.5–5.3)
Sodium: 136 mmol/L (ref 135–146)
Total Bilirubin: 0.8 mg/dL (ref 0.2–1.2)
Total Protein: 7.5 g/dL (ref 6.1–8.1)

## 2017-08-03 NOTE — Progress Notes (Signed)
HPI: Jeff Aguirre is a 57 y.o. male who  has a past medical history of Anxiety, Colon polyps, and Hypertension.  he presents to [facility] today, 08/03/17,  for chief complaint of:  BP recheck  Labs follow-up  Last visit was w/ nurse to verify home BP cuff, which was close enough. He is definitely higher in the office than at home. Home BP readings logged and he has these with his to show me. No problems on new medications.      Elevated liver enzymes: stable, due to follow up with GI  Low sodium: incidental on last blood draw, will recheck today   Past medical history, surgical history, social history and family history reviewed. No updates needed.   Current medication list and allergy/intolerance information reviewed.    Current Outpatient Medications on File Prior to Visit  Medication Sig Dispense Refill  . amLODipine (NORVASC) 5 MG tablet Take 1 tablet (5 mg total) by mouth daily. 30 tablet 1  . escitalopram (LEXAPRO) 20 MG tablet take 1 tablet by mouth once daily 90 tablet 1  . propranolol ER (INDERAL LA) 160 MG SR capsule Take 1 capsule (160 mg total) by mouth daily. 30 capsule 0   No current facility-administered medications on file prior to visit.    Allergies  Allergen Reactions  . Morphine Itching    REACTION: itching      Review of Systems:  Constitutional: No recent illness  HEENT: No  headache, no vision change  Cardiac: No  chest pain, No  pressure, No palpitations  Respiratory:  No  shortness of breath. No  Cough  Gastrointestinal: No  abdominal pain\  Musculoskeletal: No new myalgia/arthralgia  Skin: No  Rash  Neurologic: No  weakness, No  Dizziness  Psychiatric: No  concerns with depression, No  concerns with anxiety  Exam:  BP (!) 172/90 (BP Location: Left Arm)   Pulse 72   Temp 98.4 F (36.9 C) (Oral)   Wt 156 lb 1.3 oz (70.8 kg)   BMI 22.40 kg/m   Constitutional: VS see above. General  Appearance: alert, well-developed, well-nourished, NAD  Eyes: Normal lids and conjunctive, non-icteric sclera  Ears, Nose, Mouth, Throat: MMM, Normal external inspection ears/nares/mouth/lips/gums.  Neck: No masses, trachea midline.   Respiratory: Normal respiratory effort. no wheeze, no rhonchi, no rales  Cardiovascular: S1/S2 normal, no murmur, no rub/gallop auscultated. RRR.   Musculoskeletal: Gait normal. Symmetric and independent movement of all extremities  Neurological: Normal balance/coordination. No tremor.  Skin: warm, dry, intact.   Psychiatric: Normal judgment/insight. Normal mood and affect. Oriented x3.     ASSESSMENT/PLAN:   White coat syndrome with hypertension  Elevated liver enzymes - Plan: COMPLETE METABOLIC PANEL WITH GFR  Hyponatremia - Plan: COMPLETE METABOLIC PANEL WITH GFR   Orders Placed This Encounter  Procedures  . COMPLETE METABOLIC PANEL WITH GFR    Patient Instructions  Please set up an appointment with GI to follow up on the liver enzymes     Follow-up plan: Return in about 6 months (around 02/03/2018) for [place] SOONER IF NEEDED .  Visit summary with medication list and pertinent instructions was printed for patient to review, alert [place] if any changes needed. All questions at time of visit were answered - patient instructed to contact office with any additional concerns. ER/RTC precautions were reviewed with the patient and understanding verbalized.    Please note: voice recognition software was used to produce this document, and  typos may escape review. Please contact Dr. Sheppard Coil for any needed clarifications.

## 2017-08-03 NOTE — Patient Instructions (Signed)
Please set up an appointment with GI to follow up on the liver enzymes

## 2017-08-13 ENCOUNTER — Other Ambulatory Visit: Payer: Self-pay | Admitting: Osteopathic Medicine

## 2017-08-13 ENCOUNTER — Other Ambulatory Visit: Payer: Self-pay

## 2017-08-13 DIAGNOSIS — I10 Essential (primary) hypertension: Secondary | ICD-10-CM

## 2017-08-13 DIAGNOSIS — F411 Generalized anxiety disorder: Secondary | ICD-10-CM

## 2017-08-13 MED ORDER — PROPRANOLOL HCL ER 160 MG PO CP24: 160.0000 mg | ORAL_CAPSULE | Freq: Every day | ORAL | 1 refills | Status: DC

## 2017-08-17 ENCOUNTER — Other Ambulatory Visit: Payer: Self-pay

## 2017-08-17 DIAGNOSIS — F411 Generalized anxiety disorder: Secondary | ICD-10-CM

## 2017-08-17 MED ORDER — ESCITALOPRAM OXALATE 20 MG PO TABS: 20.0000 mg | ORAL_TABLET | Freq: Every day | ORAL | 1 refills | Status: DC

## 2017-08-17 MED ORDER — AMLODIPINE BESYLATE 5 MG PO TABS: 5.0000 mg | ORAL_TABLET | Freq: Every day | ORAL | 1 refills | Status: DC

## 2018-02-01 ENCOUNTER — Encounter: Payer: Self-pay | Admitting: Osteopathic Medicine

## 2018-02-01 ENCOUNTER — Ambulatory Visit (INDEPENDENT_AMBULATORY_CARE_PROVIDER_SITE_OTHER): Payer: BLUE CROSS/BLUE SHIELD | Admitting: Osteopathic Medicine

## 2018-02-01 VITALS — BP 161/95 | HR 73 | Temp 98.4°F | Wt 160.1 lb

## 2018-02-01 DIAGNOSIS — F411 Generalized anxiety disorder: Secondary | ICD-10-CM | POA: Diagnosis not present

## 2018-02-01 DIAGNOSIS — Z0000 Encounter for general adult medical examination without abnormal findings: Secondary | ICD-10-CM

## 2018-02-01 DIAGNOSIS — R972 Elevated prostate specific antigen [PSA]: Secondary | ICD-10-CM

## 2018-02-01 DIAGNOSIS — I10 Essential (primary) hypertension: Secondary | ICD-10-CM

## 2018-02-01 DIAGNOSIS — R29898 Other symptoms and signs involving the musculoskeletal system: Secondary | ICD-10-CM

## 2018-02-01 MED ORDER — PROPRANOLOL HCL ER 160 MG PO CP24: 160.0000 mg | ORAL_CAPSULE | Freq: Every day | ORAL | 1 refills | Status: DC

## 2018-02-01 MED ORDER — ESCITALOPRAM OXALATE 20 MG PO TABS: 20.0000 mg | ORAL_TABLET | Freq: Every day | ORAL | 1 refills | Status: DC

## 2018-02-01 MED ORDER — ASPIRIN EC 325 MG PO TBEC: 325.0000 mg | DELAYED_RELEASE_TABLET | Freq: Every day | ORAL | 0 refills | Status: DC

## 2018-02-01 MED ORDER — ATORVASTATIN CALCIUM 40 MG PO TABS: 40.0000 mg | ORAL_TABLET | Freq: Every day | ORAL | 3 refills | Status: DC

## 2018-02-01 MED ORDER — AMLODIPINE BESYLATE 5 MG PO TABS: 5.0000 mg | ORAL_TABLET | Freq: Every day | ORAL | 1 refills | Status: DC

## 2018-02-01 NOTE — Patient Instructions (Addendum)
General Preventive Care  Most recent routine screening lipids/other labs: ordered today   Tobacco: don't!   Alcohol: moderation is ok for most people.   Recreational/Illicit Drugs: don't!  Exercise: as tolerated to reduce risk of cardiovascular disease and diabetes  Mental health: if need for mental health care (medicines, counseling, other), or concerns about moods, please let me know!   Sexual health: if need for STD testing, please let me know!   Vaccines  Flu vaccine: recommended every fall (by Halloween!) - declined today   Shingles vaccine: Shingrix recommended after age 68 -   Pneumonia vaccines: Prevnar and Pneumovax recommended after age 37  Tetanus booster: Tdap recommended every 10 years - you'll be due 2023  Cancer screenings   Colon cancer screening: [facility], Phone: [phone]  Prostate cancer screening: recommendations vary, optional PSA blood test for men around age 9  Infection screenings . Gonorrhea/Chlamydia: screening as needed . Hepatitis C: recommended once for anyone born 1947-1965, we did this in 2017  Other . Bone Density Test: recommended for men at age 109, sooner depending on risk factors . Advanced Directive: Living Will and/or Healthcare Power of Attorney recommended for everyone, regardless of age or health . Cholesterol & Diabetes: recommended screening annually

## 2018-02-01 NOTE — Progress Notes (Addendum)
HPI: Jeff Aguirre is a 57 y.o. male who  has a past medical history of Anxiety, Colon polyps, and Hypertension.  he presents to [facility] today, 02/01/18,  for chief complaint of: Annual Physical  L hand weakness    Patient here for annual physical / wellness exam.  See preventive care reviewed as below.  Recent labs reviewed in detail with the patient.   Additional concerns today include:  L hand weakness . Context: no injury or inciting event . Location: L hand/wrist . Quality: weakness, trouble extending wrist . Duration/severity: started 2 weeks ago, getting slowly better over the past 2 weeks     Past medical, surgical, social and family history reviewed:  Patient Active Problem List   Diagnosis Date Noted  . Generalized anxiety disorder 05/06/2016  . Hyponatremia 04/10/2016  . Elevated liver enzymes 04/10/2016  . Essential hypertension 02/18/2016  . Anxiety state 02/18/2016  . Panic anxiety syndrome 02/18/2016  . Adenomatous polyps 02/23/2012  . Active smoker 10/17/2011  . URI 07/03/2008    Past Surgical History:  Procedure Laterality Date  . growth on testicle removed  2000's   benign  . LUNG SURGERY      Social History   Tobacco Use  . Smoking status: Current Every Day Smoker    Packs/day: 0.50    Years: 35.00    Pack years: 17.50    Types: Cigarettes  . Smokeless tobacco: Former Systems developer    Types: Chew    Quit date: 05/26/1976  Substance Use Topics  . Alcohol use: Yes    Alcohol/week: 12.0 - 18.0 standard drinks    Types: 12 - 18 Cans of beer per week    Family History  Problem Relation Age of Onset  . Diabetes Mother   . Hypertension Mother   . Cancer Mother        breast  . Diabetes Father   . Hypertension Father   . Heart disease Father   . Diabetes Sister   . Hypertension Sister   . Diabetes Brother   . Hypertension Brother      Current medication list and allergy/intolerance information  reviewed:    Current Outpatient Medications  Medication Sig Dispense Refill  . amLODipine (NORVASC) 5 MG tablet Take 1 tablet (5 mg total) by mouth daily. 90 tablet 1  . escitalopram (LEXAPRO) 20 MG tablet Take 1 tablet (20 mg total) by mouth daily. 90 tablet 1  . propranolol ER (INDERAL LA) 160 MG SR capsule Take 1 capsule (160 mg total) by mouth daily. 90 capsule 1   No current facility-administered medications for this visit.     Allergies  Allergen Reactions  . Morphine Itching    REACTION: itching      Review of Systems:  Constitutional:  No  fever, no chills, No recent illness, No unintentional weight changes. No significant fatigue.   HEENT: No  headache, no vision change, no hearing change, No sore throat, No  sinus pressure  Cardiac: No  chest pain, No  pressure, No palpitations, No  Orthopnea  Respiratory:  No  shortness of breath. No  Cough  Gastrointestinal: No  abdominal pain, No  nausea, No  vomiting,  No  blood in stool, No  diarrhea, No  constipation   Musculoskeletal: No new myalgia/arthralgia  Skin: No  Rash, No other wounds/concerning lesions  Genitourinary: No  incontinence, No  abnormal genital bleeding, No abnormal genital discharge  Hem/Onc: No  easy bruising/bleeding,  No  abnormal lymph node  Endocrine: No cold intolerance,  No heat intolerance. No polyuria/polydipsia/polyphagia   Neurologic: +weakness as per HPI, No  dizziness, No  slurred speech/focal weakness/facial droop  Psychiatric: No  concerns with depression, No  concerns with anxiety, No sleep problems, No mood problems  Exam:  BP (!) 161/95 (BP Location: Left Arm, Patient Position: Sitting, Cuff Size: Normal)   Pulse 73   Temp 98.4 F (36.9 C) (Oral)   Wt 160 lb 1.6 oz (72.6 kg)   BMI 22.97 kg/m   Constitutional: VS see above. General Appearance: alert, well-developed, well-nourished, NAD  Eyes: Normal lids and conjunctive, non-icteric sclera  Ears, Nose, Mouth, Throat: MMM,  Normal external inspection ears/nares/mouth/lips/gums. TM normal bilaterally. Pharynx/tonsils no erythema, no exudate.   Neck: No masses, trachea midline. No thyroid enlargement.  Respiratory: Normal respiratory effort. no wheeze, no rhonchi, no rales  Cardiovascular: S1/S2 normal, no murmur, no rub/gallop auscultated. RRR. No lower extremity edema.   Gastrointestinal: Nontender, no masses. No hepatomegaly, no splenomegaly. No hernia appreciated. Bowel sounds normal. Rectal exam deferred.   Musculoskeletal: Gait normal. No clubbing/cyanosis of digits.   Neurological: Normal balance/coordination. No tremor. No cranial nerve deficit on limited exam. Sensation intact and symmetric. Cerebellar reflexes intact. EOMI, PERRL. L hand weak grip, weaker to extension at wrist, weak intrinsic hand movements   Skin: warm, dry, intact. No rash/ulcer. No concerning nevi or subq nodules on limited exam.    Psychiatric: Normal judgment/insight. Normal mood and affect. Oriented x3.   Immunization History  Administered Date(s) Administered  . Influenza,inj,Quad PF,6+ Mos 07/06/2017  . Tdap 10/15/2011       ASSESSMENT/PLAN: The primary encounter diagnosis was Annual physical exam. Diagnoses of Essential hypertension, White coat syndrome with hypertension, Anxiety state, Weakness of left hand, and Elevated PSA measurement were also pertinent to this visit.  Meds ordered this encounter  Medications  . amLODipine (NORVASC) 5 MG tablet    Sig: Take 1 tablet (5 mg total) by mouth daily.    Dispense:  90 tablet    Refill:  1  . escitalopram (LEXAPRO) 20 MG tablet    Sig: Take 1 tablet (20 mg total) by mouth daily.    Dispense:  90 tablet    Refill:  1    Please cancel Lexapro 10 mg, Please cancel order for 30-days supply  . propranolol ER (INDERAL LA) 160 MG SR capsule    Sig: Take 1 capsule (160 mg total) by mouth daily.    Dispense:  90 capsule    Refill:  1  . aspirin EC 325 MG tablet    Sig:  Take 1 tablet (325 mg total) by mouth daily.    Dispense:  30 tablet    Refill:  0  . atorvastatin (LIPITOR) 40 MG tablet    Sig: Take 1 tablet (40 mg total) by mouth daily.    Dispense:  90 tablet    Refill:  3   Orders Placed This Encounter  Procedures  . MR Brain W Wo Contrast  . MR Angiogram Head Wo Contrast  . CBC  . COMPLETE METABOLIC PANEL WITH GFR  . Lipid panel  . TSH  . PSA, Total with Reflex to PSA, Free  . Ambulatory referral to Neurology        Patient Instructions  General Preventive Care  Most recent routine screening lipids/other labs: ordered today   Tobacco: don't!   Alcohol: moderation is ok for most people.   Recreational/Illicit Drugs:  don't!  Exercise: as tolerated to reduce risk of cardiovascular disease and diabetes  Mental health: if need for mental health care (medicines, counseling, other), or concerns about moods, please let me know!   Sexual health: if need for STD testing, please let me know!   Vaccines  Flu vaccine: recommended every fall (by Halloween!) - declined today   Shingles vaccine: Shingrix recommended after age 56 -   Pneumonia vaccines: Prevnar and Pneumovax recommended after age 48  Tetanus booster: Tdap recommended every 10 years - you'll be due 2023  Cancer screenings   Colon cancer screening: [facility], Phone: [phone]  Prostate cancer screening: recommendations vary, optional PSA blood test for men around age 81  Infection screenings . Gonorrhea/Chlamydia: screening as needed . Hepatitis C: recommended once for anyone born 1945-1965, we did this in 2017  Other . Bone Density Test: recommended for men at age 87, sooner depending on risk factors . Advanced Directive: Living Will and/or Healthcare Power of Attorney recommended for everyone, regardless of age or health . Cholesterol & Diabetes: recommended screening annually    Visit summary with medication list and pertinent  instructions was printed for patient to review. All questions at time of visit were answered - patient instructed to contact office with any additional concerns. ER/RTC precautions were reviewed with the patient.   Follow-up plan: Return in about 1 week (around 02/08/2018) for review results, recheck BP .  Note: Total time spent on problem based portion of exam was 25 minutes, greater than 50% of the visit was spent face-to-face counseling and coordinating care for the following: Diagnoses of Essential hypertension, White coat syndrome with hypertension, and Anxiety state were also pertinent to this visit.Marland Kitchen  Please note: voice recognition software was used to produce this document, and typos may escape review. Please contact Dr. Sheppard Coil for any needed clarifications.

## 2018-02-02 ENCOUNTER — Telehealth: Payer: Self-pay | Admitting: Osteopathic Medicine

## 2018-02-02 ENCOUNTER — Other Ambulatory Visit: Payer: Self-pay

## 2018-02-02 LAB — COMPLETE METABOLIC PANEL WITH GFR
AG Ratio: 1.8 (calc) (ref 1.0–2.5)
ALT: 115 U/L — ABNORMAL HIGH (ref 9–46)
AST: 116 U/L — ABNORMAL HIGH (ref 10–35)
Albumin: 4.4 g/dL (ref 3.6–5.1)
Alkaline phosphatase (APISO): 118 U/L — ABNORMAL HIGH (ref 40–115)
BUN/Creatinine Ratio: 7 (calc) (ref 6–22)
BUN: 6 mg/dL — ABNORMAL LOW (ref 7–25)
CO2: 29 mmol/L (ref 20–32)
Calcium: 9.7 mg/dL (ref 8.6–10.3)
Chloride: 96 mmol/L — ABNORMAL LOW (ref 98–110)
Creat: 0.81 mg/dL (ref 0.70–1.33)
GFR, Est African American: 113 mL/min/{1.73_m2} (ref >=60)
GFR, Est Non African American: 97 mL/min/{1.73_m2} (ref >=60)
Globulin: 2.5 g/dL (calc) (ref 1.9–3.7)
Glucose, Bld: 86 mg/dL (ref 65–139)
Potassium: 4.7 mmol/L (ref 3.5–5.3)
Sodium: 133 mmol/L — ABNORMAL LOW (ref 135–146)
Total Bilirubin: 0.6 mg/dL (ref 0.2–1.2)
Total Protein: 6.9 g/dL (ref 6.1–8.1)

## 2018-02-02 LAB — CBC
HCT: 44.2 % (ref 38.5–50.0)
Hemoglobin: 15.1 g/dL (ref 13.2–17.1)
MCH: 32.8 pg (ref 27.0–33.0)
MCHC: 34.2 g/dL (ref 32.0–36.0)
MCV: 95.9 fL (ref 80.0–100.0)
MPV: 10.6 fL (ref 7.5–12.5)
Platelets: 161 10*3/uL (ref 140–400)
RBC: 4.61 10*6/uL (ref 4.20–5.80)
RDW: 12.9 % (ref 11.0–15.0)
WBC: 8.8 10*3/uL (ref 3.8–10.8)

## 2018-02-02 LAB — REFLEX PSA, FREE
PSA, % Free: 10 % (calc) — ABNORMAL LOW (ref >25)
PSA, Free: 0.4 ng/mL

## 2018-02-02 LAB — LIPID PANEL
Cholesterol: 162 mg/dL (ref <200)
HDL: 89 mg/dL (ref >40)
LDL Cholesterol (Calc): 60 mg/dL (calc)
Non-HDL Cholesterol (Calc): 73 mg/dL (calc) (ref <130)
Total CHOL/HDL Ratio: 1.8 (calc) (ref <5.0)
Triglycerides: 58 mg/dL (ref <150)

## 2018-02-02 LAB — PSA, TOTAL WITH REFLEX TO PSA, FREE: PSA, Total: 4.1 ng/mL — ABNORMAL HIGH (ref <=4.0)

## 2018-02-02 LAB — TSH: TSH: 1.29 mIU/L (ref 0.40–4.50)

## 2018-02-02 NOTE — Telephone Encounter (Signed)
-----   Message from Emeterio Reeve, DO sent at 02/01/2018  1:39 PM EDT ----- Regarding: shingrix vaccine shingrix please and thanks

## 2018-02-02 NOTE — Telephone Encounter (Signed)
Added

## 2018-02-03 DIAGNOSIS — R972 Elevated prostate specific antigen [PSA]: Secondary | ICD-10-CM | POA: Insufficient documentation

## 2018-02-03 NOTE — Addendum Note (Signed)
Addended by: Maryla Morrow on: 02/03/2018 09:47 AM   Modules accepted: Orders

## 2018-02-04 ENCOUNTER — Other Ambulatory Visit (INDEPENDENT_AMBULATORY_CARE_PROVIDER_SITE_OTHER): Payer: BLUE CROSS/BLUE SHIELD

## 2018-02-04 LAB — SEDIMENTATION RATE: Sed Rate: 6 mm/hr (ref 0–20)

## 2018-02-05 ENCOUNTER — Ambulatory Visit (INDEPENDENT_AMBULATORY_CARE_PROVIDER_SITE_OTHER): Payer: BLUE CROSS/BLUE SHIELD | Admitting: Neurology

## 2018-02-05 ENCOUNTER — Encounter: Payer: Self-pay | Admitting: Neurology

## 2018-02-05 VITALS — BP 140/80 | HR 82 | Ht 70.0 in | Wt 156.0 lb

## 2018-02-05 DIAGNOSIS — M21332 Wrist drop, left wrist: Secondary | ICD-10-CM | POA: Diagnosis not present

## 2018-02-05 LAB — IRON,TIBC AND FERRITIN PANEL
%SAT: 47 % (calc) (ref 20–48)
Ferritin: 1239 ng/mL — ABNORMAL HIGH (ref 38–380)
Iron: 153 ug/dL (ref 50–180)
TIBC: 324 mcg/dL (calc) (ref 250–425)

## 2018-02-05 NOTE — Progress Notes (Signed)
NEUROLOGY CONSULTATION NOTE  Jeff Aguirre MRN: [id number] DOB: 01-29-1961  Referring provider: Dr. Emeterio Reeve Primary care provider: Dr. Emeterio Reeve  Reason for consult:  Left hand weakness  Dear Dr Sheppard Coil:  Thank you for your kind referral of Jeff Aguirre for consultation of the above symptoms. Although his history is well known to you, please allow me to reiterate it for the purpose of our medical record. He is alone in the office today. Records and images were personally reviewed where available.  HISTORY OF PRESENT ILLNESS: This is a pleasant 57 year old right-handed man with a history of hypertension presenting for evaluation of left wrist drop. He woke up on 01/23/18 unable to dorsiflex his left hand. He also had numbness in his hand up to the wrist, mostly affecting the thumb and digits 2-4. He denies dropping things but could not grip well. Over the past 2 weeks, he has gotten a little more movement. He denies any neck or back pain. He does not recall sleeping in an odd position the night prior. 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 He denies any left facial or leg symptoms, right arm and leg unaffected. He denies any headaches, dizziness, diplopia, dysarthria/dysphagia, bowel/bladder dysfunction. No recent falls. He has not noticed any chest pain/shortness of breath/palpitations. He has been taking full dose aspirin and Lipitor since Tuesday. He occasionally drinks alcohol, no alcohol intake prior to this event. He is scheduled for an MRI/MRA brain tomorrow.   PAST MEDICAL HISTORY: Past Medical History:  Diagnosis Date  . Anxiety   . Colon polyps   . Hypertension     PAST SURGICAL HISTORY: Past Surgical History:  Procedure Laterality Date  . growth on testicle removed  2000's   benign  . LUNG SURGERY      MEDICATIONS: Current Outpatient Medications on File Prior to Visit  Medication Sig Dispense Refill  .  amLODipine (NORVASC) 5 MG tablet Take 1 tablet (5 mg total) by mouth daily. 90 tablet 1  . aspirin EC 325 MG tablet Take 1 tablet (325 mg total) by mouth daily. 30 tablet 0  . atorvastatin (LIPITOR) 40 MG tablet Take 1 tablet (40 mg total) by mouth daily. 90 tablet 3  . escitalopram (LEXAPRO) 20 MG tablet Take 1 tablet (20 mg total) by mouth daily. 90 tablet 1  . propranolol ER (INDERAL LA) 160 MG SR capsule Take 1 capsule (160 mg total) by mouth daily. 90 capsule 1   No current facility-administered medications on file prior to visit.     ALLERGIES: Allergies  Allergen Reactions  . Morphine Itching    REACTION: itching    FAMILY HISTORY: Family History  Problem Relation Age of Onset  . Diabetes Mother   . Hypertension Mother   . Cancer Mother        breast  . Diabetes Father   . Hypertension Father   . Heart disease Father   . Diabetes Sister   . Hypertension Sister   . Diabetes Brother   . Hypertension Brother     SOCIAL HISTORY: Social History   Socioeconomic History  . Marital status: Legally Separated    Spouse name: Not on file  . Number of children: 1  . Years of education: Not on file  . Highest education level: Not on file  Occupational History  . Occupation: Designer, fashion/clothing  Social Needs  . Financial resource strain: Not on file  .  Food insecurity:    Worry: Not on file    Inability: Not on file  . Transportation needs:    Medical: Not on file    Non-medical: Not on file  Tobacco Use  . Smoking status: Current Every Day Smoker    Packs/day: 0.50    Years: 35.00    Pack years: 17.50    Types: Cigarettes  . Smokeless tobacco: Former Systems developer    Types: [place] date: 05/26/1976  Substance and Sexual Activity  . Alcohol use: Yes    Alcohol/week: 12.0 - 18.0 standard drinks    Types: 12 - 18 Cans of beer per week  . Drug use: No  . Sexual activity: Not on file  Lifestyle  . Physical activity:    Days per week: Not on file    Minutes per session:  Not on file  . Stress: Not on file  Relationships  . Social connections:    Talks on phone: Not on file    Gets together: Not on file    Attends religious service: Not on file    Active member of club or organization: Not on file    Attends meetings of clubs or organizations: Not on file    Relationship status: Not on file  . Intimate partner violence:    Fear of current or ex partner: Not on file    Emotionally abused: Not on file    Physically abused: Not on file    Forced sexual activity: Not on file  Other Topics Concern  . Not on file  Social History Narrative  . Not on file    REVIEW OF SYSTEMS: Constitutional: No fevers, chills, or sweats, no generalized fatigue, change in appetite Eyes: No visual changes, double vision, eye pain Ear, nose and throat: No hearing loss, ear pain, nasal congestion, sore throat Cardiovascular: No chest pain, palpitations Respiratory:  No shortness of breath at rest or with exertion, wheezes GastrointestinaI: No nausea, vomiting, diarrhea, abdominal pain, fecal incontinence Genitourinary:  No dysuria, urinary retention or frequency Musculoskeletal:  No neck pain, back pain Integumentary: No rash, pruritus, skin lesions Neurological: as above Psychiatric: No depression, insomnia, anxiety Endocrine: No palpitations, fatigue, diaphoresis, mood swings, change in appetite, change in weight, increased thirst Hematologic/Lymphatic:  No anemia, purpura, petechiae. Allergic/Immunologic: no itchy/runny eyes, nasal congestion, recent allergic reactions, rashes  PHYSICAL EXAM: Vitals:   02/05/18 1246  BP: 140/80  Pulse: 82  SpO2: 98%   General: No acute distress Head:  Normocephalic/atraumatic Eyes: Fundoscopic exam shows bilateral sharp discs, no vessel changes, exudates, or hemorrhages Neck: supple, no paraspinal tenderness, full range of motion Back: No paraspinal tenderness Heart: regular rate and rhythm Lungs: Clear to auscultation  bilaterally. Vascular: No carotid bruits. Skin/Extremities: No rash, no edema Neurological Exam: Mental status: alert and oriented to person, place, and time, no dysarthria or aphasia, Fund of knowledge is appropriate.  Recent and remote memory are intact.  Attention and concentration are normal.    Able to name objects and repeat phrases. Cranial nerves: CN I: not tested CN II: pupils equal, round and reactive to light, visual fields intact, fundi unremarkable. CN III, IV, VI:  full range of motion, no nystagmus, no ptosis CN V: facial sensation intact CN VII: upper and lower face symmetric CN VIII: hearing intact to finger rub CN IX, X: gag intact, uvula midline CN XI: sternocleidomastoid and trapezius muscles intact CN XII: tongue midline Bulk & Tone: normal, no fasciculations. Motor: 5/5  on right UE and LE, left LE, proximal left UE, 3+/5 left hand dorsiflexion at the wrist, finger extension. Supination appears intact He does appear to also have difficulty abducting/spreading fingers (interossei), no pronator drift. Sensation: intact to light touch, cold, pin on right UE and LE, left LE, proximal left UE, decreased pin and cold on dorsum of hand base of thumb and finger tips (more on 2nd and 3rd digits).  No extinction to double simultaneous stimulation.  Romberg test negative Deep Tendon Reflexes: +1 both UE, +2 both LE, no ankle clonus Plantar responses: downgoing bilaterally Cerebellar: no incoordination on finger to nose, heel to shin. No dysdiadochokinesia Gait: narrow-based and steady, able to tandem walk adequately. Tremor: none  IMPRESSION: This is a pleasant 57 year old right-handed  man with a history of hypertension presenting with sudden onset left hand weakness suggestive of left wrist drop (radial neuropathy). He is scheduled for an MRI/MRA brain tomorrow, results will be reviewed. We discussed doing an EMG/NCV of the left UE to further evaluate symptoms. He will be  referred for physical therapy. Follow-up will depend on test results. He knows to call for any changes.   Thank you for allowing me to participate in the care of this patient. Please do not hesitate to call for any questions or concerns.   Ellouise Newer, M.D.  CC: Dr. Sheppard Coil

## 2018-02-05 NOTE — Patient Instructions (Signed)
1. Schedule EMG/NCV of the left UE with Dr. Posey Pronto 2. Refer to PT for wrist drop 3. I will keep an eye out for MRI brain results and follow-up on you with this 4. Depending on results, we will schedule follow-up

## 2018-02-06 ENCOUNTER — Ambulatory Visit (HOSPITAL_BASED_OUTPATIENT_CLINIC_OR_DEPARTMENT_OTHER)
Admission: RE | Admit: 2018-02-06 | Discharge: 2018-02-06 | Disposition: A | Discharge status: Home / Self Care | Payer: BLUE CROSS/BLUE SHIELD | Source: Ambulatory Visit | Attending: Osteopathic Medicine | Admitting: Osteopathic Medicine

## 2018-02-06 DIAGNOSIS — R29898 Other symptoms and signs involving the musculoskeletal system: Secondary | ICD-10-CM | POA: Diagnosis present

## 2018-02-06 DIAGNOSIS — R9082 White matter disease, unspecified: Secondary | ICD-10-CM | POA: Insufficient documentation

## 2018-02-06 MED ORDER — GADOBENATE DIMEGLUMINE 529 MG/ML IV SOLN
15.0000 mL | Freq: Once | INTRAVENOUS | Status: AC | PRN
  Administered 2018-02-06: 13 mL via INTRAVENOUS

## 2018-02-10 ENCOUNTER — Ambulatory Visit (INDEPENDENT_AMBULATORY_CARE_PROVIDER_SITE_OTHER): Payer: BLUE CROSS/BLUE SHIELD | Admitting: Osteopathic Medicine

## 2018-02-10 ENCOUNTER — Encounter: Payer: Self-pay | Admitting: Osteopathic Medicine

## 2018-02-10 DIAGNOSIS — M6281 Muscle weakness (generalized): Secondary | ICD-10-CM | POA: Diagnosis not present

## 2018-02-10 DIAGNOSIS — E871 Hypo-osmolality and hyponatremia: Secondary | ICD-10-CM | POA: Diagnosis not present

## 2018-02-10 DIAGNOSIS — R972 Elevated prostate specific antigen [PSA]: Secondary | ICD-10-CM

## 2018-02-10 DIAGNOSIS — R945 Abnormal results of liver function studies: Secondary | ICD-10-CM

## 2018-02-10 DIAGNOSIS — Z23 Encounter for immunization: Secondary | ICD-10-CM

## 2018-02-10 DIAGNOSIS — R7989 Other specified abnormal findings of blood chemistry: Secondary | ICD-10-CM

## 2018-02-10 NOTE — Progress Notes (Signed)
HPI: Jeff Aguirre is a 57 y.o. male who  has a past medical history of Anxiety, Colon polyps, and Hypertension.  he presents to [facility] today, 02/10/18,  for chief complaint of:  Review results  Elevated liver enzymes due to hemochromatosis, significantly elevated from previous check, patient has a follow-up appointment with GI  Recent MRI did not show any reason for weakness in upper extremity, he is following with neurology and they are planning for EMG/NCS  Mild hyponatremia on labs, patient states that he has been significantly restricting his salt intake due to concerns for high blood pressure.    Past medical history, surgical history, and family history reviewed.  Current medication list and allergy/intolerance information reviewed.   (See remainder of HPI, ROS, Phys Exam below)     ASSESSMENT/PLAN:   Elevated LFTs - Following with GI, patient has appointment tomorrow - Plan: COMPLETE METABOLIC PANEL WITH GFR  Need for influenza vaccination - Plan: Flu Vaccine QUAD 6+ mos PF IM (Fluarix Quad PF)  Hyponatremia - Patient reports dietary restriction, advised liberalize salt intake and will monitor  Muscle weakness of left upper extremity - Continue follow with neurology  Elevated PSA, less than 10 ng/ml - We will recheck PSA and if improved, will just monitor in another 6 months but if stable/increased we will go ahead and send to urology     Follow-up plan: Return for LAB VISIT ONLY late October to recheck PSA, sooner if needed! .                   ############################################ ############################################ ############################################ ############################################    Outpatient Encounter Medications as of 02/10/2018  Medication Sig  . amLODipine (NORVASC) 5 MG tablet Take 1 tablet (5 mg total) by mouth daily.  Marland Kitchen aspirin EC 325 MG tablet Take 1  tablet (325 mg total) by mouth daily.  Marland Kitchen atorvastatin (LIPITOR) 40 MG tablet Take 1 tablet (40 mg total) by mouth daily.  Marland Kitchen escitalopram (LEXAPRO) 20 MG tablet Take 1 tablet (20 mg total) by mouth daily.  . propranolol ER (INDERAL LA) 160 MG SR capsule Take 1 capsule (160 mg total) by mouth daily.   No facility-administered encounter medications on file as of 02/10/2018.    Allergies  Allergen Reactions  . Morphine Itching    REACTION: itching      Review of Systems:  Constitutional: No recent illness  Cardiac: No  chest pain, No  pressure, No palpitations  Respiratory:  No  shortness of breath. No  Cough  Gastrointestinal: No  abdominal pain, no change on bowel habits  Musculoskeletal: No new myalgia/arthralgia  Psychiatric: No  concerns with depression, No  concerns with anxiety  Exam:  BP 137/86 (BP Location: Left Arm, Patient Position: Sitting, Cuff Size: Normal)   Pulse 78   Temp 98.6 F (37 C) (Oral)   Wt 154 lb 11.2 oz (70.2 kg)   BMI 22.20 kg/m   Constitutional: VS see above. General Appearance: alert, well-developed, well-nourished, NAD  Eyes: Normal lids and conjunctive, non-icteric sclera  Ears, Nose, Mouth, Throat: MMM, Normal external inspection ears/nares/mouth/lips/gums.  Neck: No masses, trachea midline.   Respiratory: Normal respiratory effort. no wheeze, no rhonchi, no rales  Cardiovascular: S1/S2 normal, no murmur, no rub/gallop auscultated. RRR.   Musculoskeletal: Gait normal. Symmetric and independent movement of all extremities  Neurological: Normal balance/coordination. No tremor.  Skin: warm, dry, intact.   Psychiatric: Normal judgment/insight. Normal mood and affect. Oriented x3.  Visit summary with medication list and pertinent instructions was printed for patient to review, advised to alert [place] if any changes needed. All questions at time of visit were answered - patient instructed to contact office with any additional concerns.  ER/RTC precautions were reviewed with the patient and understanding verbalized.   Follow-up plan: Return for LAB VISIT ONLY late October to recheck PSA, sooner if needed! .  Note: Total time spent 15 minutes, greater than 50% of the visit was spent face-to-face counseling and coordinating care for the following: Diagnoses of Elevated LFTs, Need for influenza vaccination, Hyponatremia, Muscle weakness of left upper extremity, and Elevated PSA, less than 10 ng/ml were pertinent to this visit.Marland Kitchen  Please note: voice recognition software was used to produce this document, and typos may escape review. Please contact Dr. Sheppard Coil for any needed clarifications.

## 2018-02-11 ENCOUNTER — Ambulatory Visit: Payer: Self-pay | Admitting: Gastroenterology

## 2018-02-16 ENCOUNTER — Ambulatory Visit (INDEPENDENT_AMBULATORY_CARE_PROVIDER_SITE_OTHER): Payer: BLUE CROSS/BLUE SHIELD | Admitting: Physician Assistant

## 2018-02-16 ENCOUNTER — Encounter: Payer: Self-pay | Admitting: Physician Assistant

## 2018-02-16 VITALS — BP 120/86 | HR 90 | Ht 70.0 in | Wt 156.0 lb

## 2018-02-16 DIAGNOSIS — R945 Abnormal results of liver function studies: Secondary | ICD-10-CM

## 2018-02-16 DIAGNOSIS — R7989 Other specified abnormal findings of blood chemistry: Secondary | ICD-10-CM | POA: Diagnosis not present

## 2018-02-16 NOTE — Progress Notes (Signed)
Chief Complaint: Elevated LFTs, elevated ferritin  HPI:    Mr. Jeff Aguirre is a 57 year old Caucasian male with a past medical history as listed below including elevated LFTs, who was assigned to Dr. Silverio Decamp and presents to clinic today for follow-up of his elevated liver enzymes and elevated ferritin.    05/13/2016 office visit with me to discuss elevated liver enzymes.  At that time had a recent ultrasound showing fatty infiltration and/or hepatocellular disease.  Further liver serologies were ordered to include iron studies, ANA, AMA, ASMA, alpha-1 antitrypsin plasma.  ANA was positive.  Hemochromatosis labs showed heterozygous for the H63D mutation.  Negative for C282Y mutation.  It was recommended he have repeat iron panel and ferritin at least 6 to 12 months.     02/01/2018 CMP by PCP showed elevated alk phos at 118, AST 116 and ALT 115.  02/04/2018 iron panel showed a ferritin of 1239 was otherwise normal.  ESR normal at 6.    Today, patient presents to clinic and explains that he is feeling fine.  He has had no real trouble other than some weakness in his left hand which he had worked up for a stroke with an MRI which was negative.  He still has follow-up with a neurologist and a nerve study.  Tells me that other than this he is doing well.  Does admit to occasional alcohol use, maybe a beer here or there, but this is not chronic.    Denies fever, chills, weight loss, anorexia, nausea, vomiting, heartburn, reflux, change in bowel habits or abdominal pain.  Past Medical History:  Diagnosis Date  . Anxiety   . Colon polyps   . Hypertension     Past Surgical History:  Procedure Laterality Date  . growth on testicle removed  2000's   benign  . LUNG SURGERY      Current Outpatient Medications  Medication Sig Dispense Refill  . amLODipine (NORVASC) 5 MG tablet Take 1 tablet (5 mg total) by mouth daily. 90 tablet 1  . aspirin EC 325 MG tablet Take 1 tablet (325 mg total) by mouth daily. 30  tablet 0  . atorvastatin (LIPITOR) 40 MG tablet Take 1 tablet (40 mg total) by mouth daily. 90 tablet 3  . escitalopram (LEXAPRO) 20 MG tablet Take 1 tablet (20 mg total) by mouth daily. 90 tablet 1  . propranolol ER (INDERAL LA) 160 MG SR capsule Take 1 capsule (160 mg total) by mouth daily. 90 capsule 1   No current facility-administered medications for this visit.     Allergies as of 02/16/2018 - Review Complete 02/16/2018  Allergen Reaction Noted  . Morphine Itching     Family History  Problem Relation Age of Onset  . Diabetes Mother   . Hypertension Mother   . Cancer Mother        breast  . Diabetes Father   . Hypertension Father   . Heart disease Father   . Diabetes Sister   . Hypertension Sister   . Diabetes Brother   . Hypertension Brother     Social History   Socioeconomic History  . Marital status: Legally Separated    Spouse name: Not on file  . Number of children: 1  . Years of education: 26  . Highest education level: Not on file  Occupational History  . Occupation: Freight forwarder  Social Needs  . Financial resource strain: Not on file  . Food insecurity:    Worry: Not on file  Inability: Not on file  . Transportation needs:    Medical: Not on file    Non-medical: Not on file  Tobacco Use  . Smoking status: Current Every Day Smoker    Packs/day: 0.50    Years: 35.00    Pack years: 17.50    Types: Cigarettes  . Smokeless tobacco: Former Systems developer    Types: Bond date: 05/26/1976  Substance and Sexual Activity  . Alcohol use: Yes    Alcohol/week: 12.0 - 18.0 standard drinks    Types: 12 - 18 Cans of beer per week  . Drug use: No  . Sexual activity: Not on file  Lifestyle  . Physical activity:    Days per week: Not on file    Minutes per session: Not on file  . Stress: Not on file  Relationships  . Social connections:    Talks on phone: Not on file    Gets together: Not on file    Attends religious service: Not on file    Active member of  club or organization: Not on file    Attends meetings of clubs or organizations: Not on file    Relationship status: Not on file  . Intimate partner violence:    Fear of current or ex partner: Not on file    Emotionally abused: Not on file    Physically abused: Not on file    Forced sexual activity: Not on file  Other Topics Concern  . Not on file  Social History Narrative   Lives alone in an apartment on the 2nd floor.  No children.  Works as a Freight forwarder for [employer].  Education: high school.     Review of Systems:    Constitutional: No weight loss, fever or chills Cardiovascular: No chest pain Respiratory: No SOB Gastrointestinal: See HPI and otherwise negative   Physical Exam:  Vital signs: BP 120/86   Pulse 90   Ht _0  (1.778 m)   Wt 156 lb (70.8 kg)   BMI 22.38 kg/m   Constitutional:   Pleasant Caucasian male appears to be in NAD, Well developed, Well nourished, alert and cooperative Respiratory: Respirations even and unlabored. Lungs clear to auscultation bilaterally.   No wheezes, crackles, or rhonchi.  Cardiovascular: Normal S1, S2. No MRG. Regular rate and rhythm. No peripheral edema, cyanosis or pallor.  Gastrointestinal:  Soft, nondistended, nontender. No rebound or guarding. Normal bowel sounds. No appreciable masses or hepatomegaly. Psychiatric: Demonstrates good judgement and reason without abnormal affect or behaviors.  RELEVANT LABS AND IMAGING: CBC    Component Value Date/Time   WBC 8.8 02/01/2018 1350   RBC 4.61 02/01/2018 1350   HGB 15.1 02/01/2018 1350   HCT 44.2 02/01/2018 1350   PLT 161 02/01/2018 1350   MCV 95.9 02/01/2018 1350   MCH 32.8 02/01/2018 1350   MCHC 34.2 02/01/2018 1350   RDW 12.9 02/01/2018 1350   LYMPHSABS 1,785 02/18/2016 1550   MONOABS 850 02/18/2016 1550   EOSABS 170 02/18/2016 1550   BASOSABS 85 02/18/2016 1550    CMP     Component Value Date/Time   NA 133 (L) 02/01/2018 1350   K 4.7 02/01/2018 1350    CL 96 (L) 02/01/2018 1350   CO2 29 02/01/2018 1350   GLUCOSE 86 02/01/2018 1350   BUN 6 (L) 02/01/2018 1350   CREATININE 0.81 02/01/2018 1350   CALCIUM 9.7 02/01/2018 1350   PROT 6.9 02/01/2018 1350   ALBUMIN 4.7  04/08/2016 1627   AST 116 (H) 02/01/2018 1350   ALT 115 (H) 02/01/2018 1350   ALKPHOS 97 04/08/2016 1627   BILITOT 0.6 02/01/2018 1350   GFRNONAA 97 02/01/2018 1350   GFRAA 113 02/01/2018 1350    Assessment: 1.  Elevated LFTs: Now increasing, AST 116, ALT 115, ferritin elevated at 1239 2.  Elevated ferritin: Hemochromatosis testing in the past showed patient to be homozygous, a carrier for the disease  Plan: 1.  Discussed case with Dr. Silverio Decamp.  Recommend patient have a ultrasound-guided liver biopsy by interventional radiology for further evaluation. 2.  Advised the patient to discontinue alcohol use completely 3.  Patient to follow in clinic per recommendations after biopsy above with Dr. Silverio Decamp or myself.  Ellouise Newer, PA-C [facility] 02/16/2018, 9:46 AM  Cc: Emeterio Reeve, DO

## 2018-02-16 NOTE — Patient Instructions (Signed)
You will be contacted by Interventional Radiology about scheduling your liver biopsy  You will follow up with Dr Silverio Decamp pending results of liver biopsy  If you are age 59 or older, your body mass index should be between 23-30. Your Body mass index is 22.38 kg/m. If this is out of the aforementioned range listed, please consider follow up with your Primary Care Provider.  If you are age 50 or younger, your body mass index should be between 19-25. Your Body mass index is 22.38 kg/m. If this is out of the aformentioned range listed, please consider follow up with your Primary Care Provider.

## 2018-02-28 NOTE — Progress Notes (Signed)
Reviewed and agree with documentation and assessment and plan. K. Veena Maisee Vollman , MD   

## 2018-03-02 ENCOUNTER — Ambulatory Visit (INDEPENDENT_AMBULATORY_CARE_PROVIDER_SITE_OTHER): Payer: BLUE CROSS/BLUE SHIELD | Admitting: Neurology

## 2018-03-02 ENCOUNTER — Other Ambulatory Visit: Payer: Self-pay | Admitting: Osteopathic Medicine

## 2018-03-02 DIAGNOSIS — M21332 Wrist drop, left wrist: Secondary | ICD-10-CM | POA: Diagnosis not present

## 2018-03-02 DIAGNOSIS — G5622 Lesion of ulnar nerve, left upper limb: Secondary | ICD-10-CM

## 2018-03-02 DIAGNOSIS — G5632 Lesion of radial nerve, left upper limb: Secondary | ICD-10-CM

## 2018-03-02 NOTE — Telephone Encounter (Signed)
This pt seen at [facility] not [place] at [facility].  Rx request.

## 2018-03-02 NOTE — Procedures (Signed)
[facility]  [address], [place]  [place], [place] [address] Tel: [phone] Fax:  [phone] Test Date:  03/02/2018  Patient: Jeff Aguirre DOB: 1960-12-27 Physician: Narda Amber, DO  Sex: Male Height: 5\' 10"  Ref Phys: Ellouise Newer, MD  ID#: [id number] Temp: 35.0C Technician:    Patient Complaints: This is a 57 year old man referred for evaluation of left wrist drop.  NCV & EMG Findings: Extensive electrodiagnostic testing of the left upper extremity and additional studies of the right shows:  1. Bilateral radial sensory responses are symmetric and within normal limits.  Left ulnar sensory response shows prolonged distal peak latency (3.3 ms).  Left median sensory responses within normal limits.   2. Left radial motor response is very mildly reduced as compared to the right, however motor amplitudes are within normal limits.  Left ulnar motor response shows decreased conduction velocity across the elbow (A Elbow-B Elbow, 45 m/s).  Left median motor responses within normal limits.   3. Left ulnar F wave is within normal limits.   4. Chronic motor axonal loss changes and reduced recruitment pattern is seen in the extensor carpi radialis longus, brachioradialis, extensor digitorum, and extensor indices proprius muscles.  There is no evidence of accompanied active denervation.  There were no fibrillation potentials in the low cervical paraspinal muscle; of note, small hematoma developed at this site to which localized pressure was applied. There was no further expansion.   Impression: 1. Subacute left radial neuropathy at or above the takeoff to the brachioradialis muscle, mild in degree electrically. 2. Chronic left ulnar neuropathy with slowing across the elbow, purely demyelinating and type and mild in degree electrically.     ___________________________ Narda Amber, DO    Nerve Conduction Studies Anti Sensory Summary Table   Site NR Peak (ms) Norm Peak (ms)  P-T Amp (V) Norm P-T Amp  Left Median Anti Sensory (2nd Digit)  35C  Wrist    3.3 <3.6 19.8 >15  Left Radial Anti Sensory (Base 1st Digit)  35C  Wrist    2.3 <2.7 27.2 >14  Right Radial Anti Sensory (Base 1st Digit)  35C  Wrist    2.1 <2.7 28.7 >14  Left Ulnar Anti Sensory (5th Digit)  35C  Wrist    3.3 <3.1 10.3 >10   Motor Summary Table   Site NR Onset (ms) Norm Onset (ms) O-P Amp (mV) Norm O-P Amp Site1 Site2 Delta-0 (ms) Dist (cm) Vel (m/s) Norm Vel (m/s)  Left Median Motor (Abd Poll Brev)  35C  Wrist    3.8 <4.0 8.5 >6 Elbow Wrist 5.7 30.0 53 >50  Elbow    9.5  7.8         Left Radial Motor (Ext Ind Prop)  35C  7cm    1.6 <3.1 5.7 >5 7cm ACF 2.5 13.0 52 >50  ACF    4.1  4.7  ACF Below-Elbow 1.6 11.0 69   Below-Elbow    5.7  4.2  Below-Elbow A6ove-Elbow 0.7 4.0 57   Above-Elbow    6.4  4.1         Right Radial Motor (Ext Ind Prop)  35C  7cm    1.8 <3.1 6.3 >5        Left Ulnar Motor (Abd Dig Minimi)  35C  Wrist    2.3 <3.1 10.2 >7 B Elbow Wrist 4.6 25.0 54 >50  B Elbow    6.9  9.6  A Elbow B Elbow 2.2 10.0 45 >  50  A Elbow    9.1  8.0          F Wave Studies   NR F-Lat (ms) Lat Norm (ms) L-R F-Lat (ms)  Right Ulnar (Mrkrs) (Abd Dig Min)  35C     30.04 <33    EMG   Side Muscle Ins Act Fibs Psw Fasc Number Recrt Dur Dur. Amp Amp. Poly Poly. Comment  Left 1stDorInt Nml Nml Nml Nml Nml Nml Nml Nml Nml Nml Nml Nml N/A  Left ExtCarRadLong Nml Nml Nml Nml 2- Rapid Nml Nml Nml Nml Some 1+ N/A  Left BrachioRad Nml Nml Nml Nml 2- Rapid Nml Nml Nml Nml Some 1+ N/A  Left Ext Digitorum Nml Nml Nml Nml 1- Rapid Nml Nml Nml Nml Nml Nml N/A  Left Ext Indicis Nml Nml Nml Nml 2- Rapid Nml Nml Nml Nml Some 1+ N/A  Left PronatorTeres Nml Nml Nml Nml Nml Nml Nml Nml Nml Nml Nml Nml N/A  Left Biceps Nml Nml Nml Nml Nml Nml Nml Nml Nml Nml Nml Nml N/A  Left Triceps Nml Nml Nml Nml Nml Nml Nml Nml Nml Nml Nml Nml N/A  Left Deltoid Nml Nml Nml Nml Nml Nml Nml Nml Nml Nml Nml Nml N/A    Left Cervical Parasp Low Nml Nml Nml Nml Nml Nml Nml Nml Nml Nml Nml Nml N/A  Left FlexPolLong Nml Nml Nml Nml Nml Nml Nml Nml Nml Nml Nml Nml N/A  Left FlexCarpiUln Nml Nml Nml Nml Nml Nml Nml Nml Nml Nml Nml Nml N/A      Waveforms:

## 2018-03-04 ENCOUNTER — Ambulatory Visit (HOSPITAL_COMMUNITY): Payer: BLUE CROSS/BLUE SHIELD

## 2018-03-05 ENCOUNTER — Telehealth: Payer: Self-pay

## 2018-03-05 NOTE — Telephone Encounter (Signed)
Spoke with pt relaying message below.  He states that he has not heard from PT.  States that he is feeling great.   Will follow up with PT referral .

## 2018-03-05 NOTE — Telephone Encounter (Signed)
-----   Message from Cameron Sprang, MD sent at 03/02/2018  3:17 PM EDT ----- Pls let him know the nerve test confirms a pinched nerve in his arm causing the hand weakness. How is he feeling? Did he do the Physical therapy? Thanks

## 2018-03-25 ENCOUNTER — Telehealth: Payer: Self-pay | Admitting: Physician Assistant

## 2018-03-25 NOTE — Telephone Encounter (Signed)
Patient states he has to go out of town for work and can not make [place] appt at the [place] on Monday 11.4.19. Patient would like a call back to reschedule.

## 2018-03-25 NOTE — Telephone Encounter (Signed)
The pt was given the number provided by Tiffany at [facility] IR to call and reschedule the appt. [phone]

## 2018-03-29 ENCOUNTER — Ambulatory Visit (HOSPITAL_COMMUNITY): Payer: BLUE CROSS/BLUE SHIELD

## 2018-04-19 ENCOUNTER — Ambulatory Visit (HOSPITAL_COMMUNITY): Payer: BLUE CROSS/BLUE SHIELD

## 2018-07-30 ENCOUNTER — Other Ambulatory Visit: Payer: Self-pay | Admitting: Osteopathic Medicine

## 2018-07-30 DIAGNOSIS — F411 Generalized anxiety disorder: Secondary | ICD-10-CM

## 2018-09-16 ENCOUNTER — Other Ambulatory Visit: Payer: Self-pay | Admitting: Osteopathic Medicine

## 2019-03-05 ENCOUNTER — Other Ambulatory Visit: Payer: Self-pay | Admitting: Osteopathic Medicine

## 2019-03-05 NOTE — Telephone Encounter (Signed)
Forwarding medication refill request to the clinical pool for review. 

## 2019-04-23 IMAGING — MR MR HEAD WO/W CM
12 of 14 series · 38 of 48 positions shown · IV contrast (MULTIHANCE)
Comparison: None.

CLINICAL DATA: Numbness in weakness of the left hand over the last
2 weeks.

EXAM:
MRI HEAD WITHOUT AND WITH CONTRAST
MRA HEAD WITHOUT CONTRAST
TECHNIQUE: Multiplanar, multiecho pulse sequences of the brain and surrounding
structures were obtained without and with intravenous contrast.
Angiographic images of the head were obtained using MRA technique
without contrast.
CONTRAST:  13mL MULTIHANCE GADOBENATE DIMEGLUMINE 529 MG/ML IV SOLN

[Series 1: T1 · sagittal · 5.0mm · 0.45mm/px · 1 of 23 slices shown]
[im 1/23]
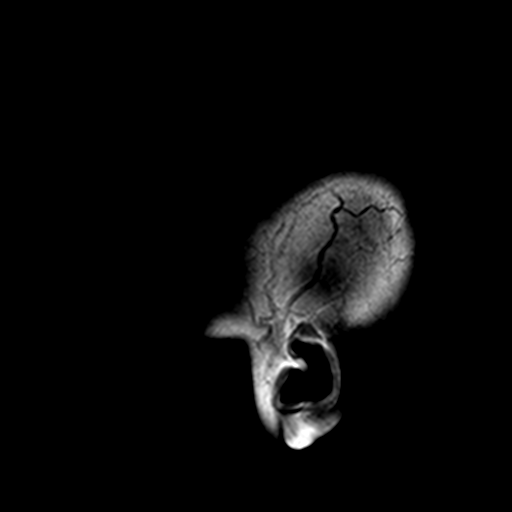

[Series 2: DWI · axial · 3.0mm · 2.19mm/px · z∈[-66,+93]mm · 7 of 93 slices shown (1 of 4)]
[im 1/93]
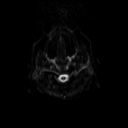
[im 16/93]
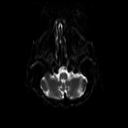
[im 31/93]
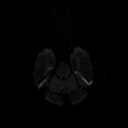
[im 47/93]
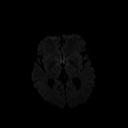
[im 62/93]
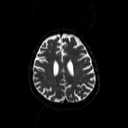
[im 77/93]
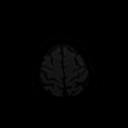
[im 93/93]
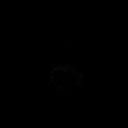

[Series 3: DWI · axial · 3.0mm · 2.19mm/px · z∈[-66,+93]mm · 4 of 50 slices shown (2 of 4)]
[im 1/50]
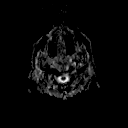
[im 17/50]
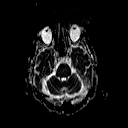
[im 33/50]
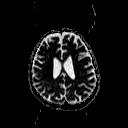
[im 50/50]
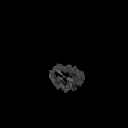

[Series 4: DWI · coronal · 3.0mm · 1.46mm/px · 8 of 94 slices shown (3 of 4)]
[im 1/94]
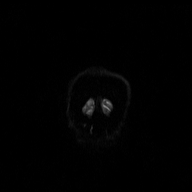
[im 14/94]
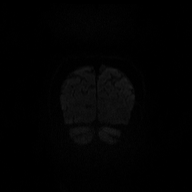
[im 27/94]
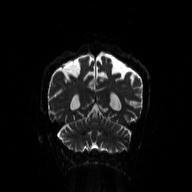
[im 40/94]
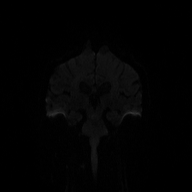
[im 54/94]
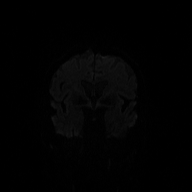
[im 67/94]
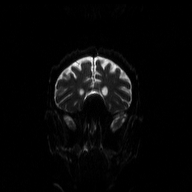
[im 80/94]
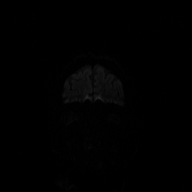
[im 94/94]
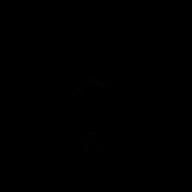

[Series 5: DWI · coronal · 3.0mm · 1.46mm/px · 4 of 47 slices shown (4 of 4)]
[im 1/47]
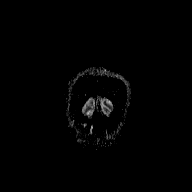
[im 16/47]
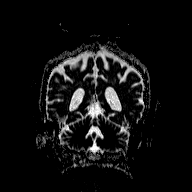
[im 31/47]
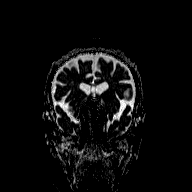
[im 47/47]
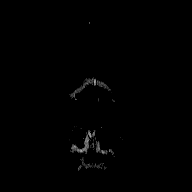

[Series 6: T2 · axial · 5.0mm · 0.45mm/px · z∈[-70,+89]mm · 2 of 24 slices shown (1 of 2)]
[im 1/24]
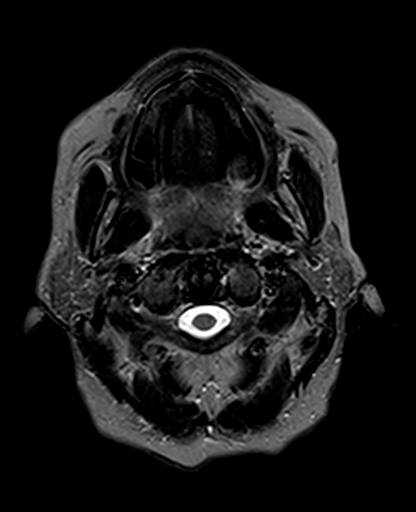
[im 24/24]
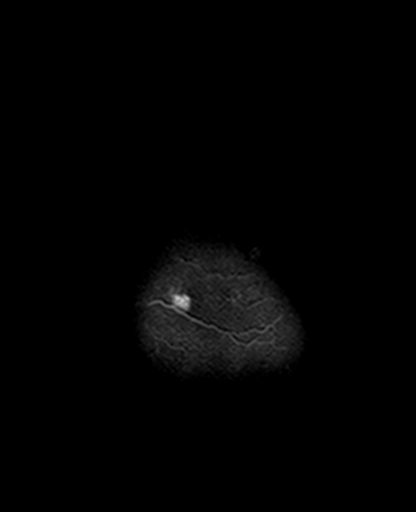

[Series 7: FLAIR · axial · 3.0mm · 0.45mm/px · z∈[-68,+87]mm · 2 of 27 slices shown]
[im 1/27]
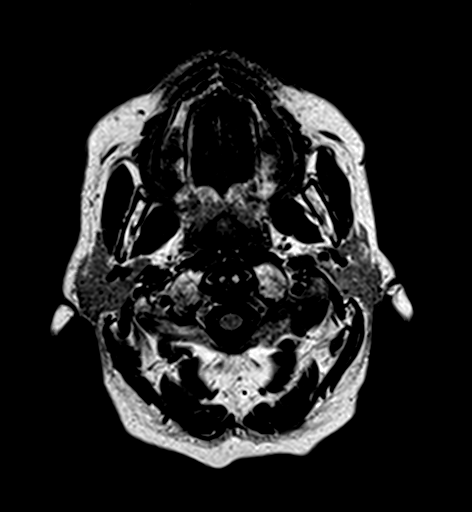
[im 27/27]
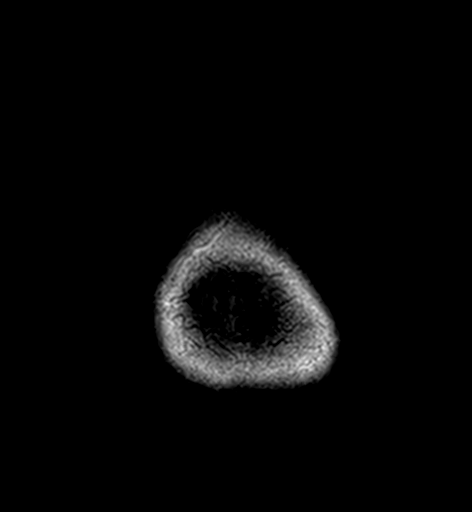

[Series 8: T2 · axial · 5.0mm · 0.45mm/px · z∈[-70,+89]mm · 2 of 24 slices shown (2 of 2)]
[im 1/24]
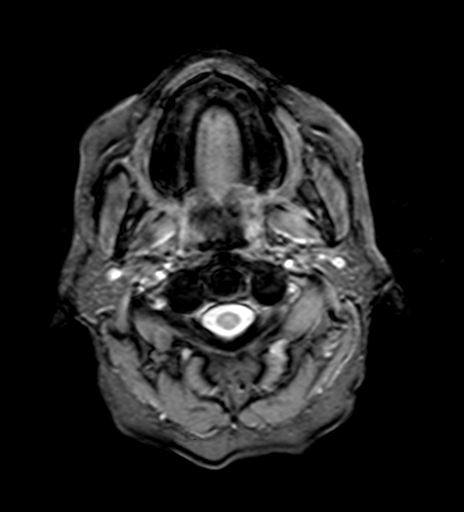
[im 24/24]
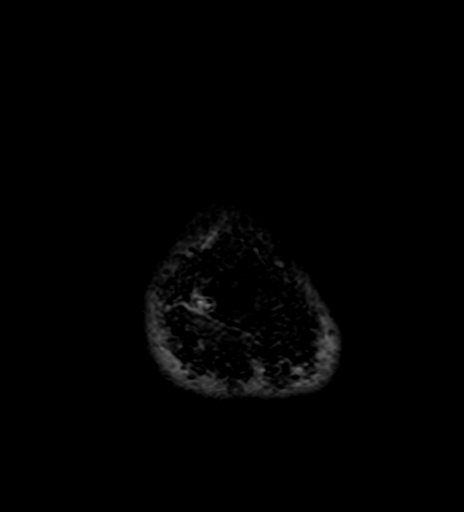

[Series 10: T2 post-contrast · coronal · 5.0mm · 0.45mm/px · 2 of 28 slices shown]
[im 1/28]
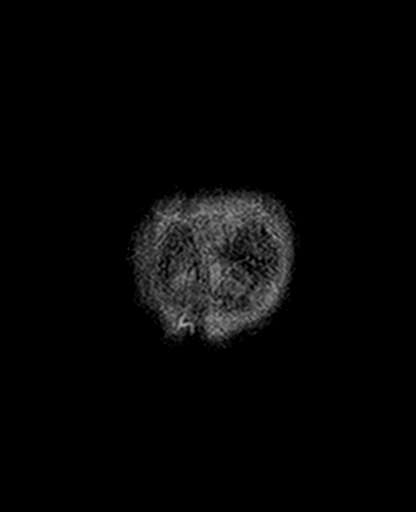
[im 28/28]
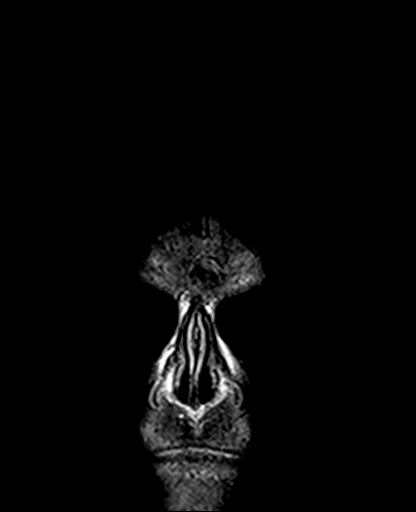

[Series 12: T1 post-contrast · coronal · 5.0mm · 0.45mm/px · 2 of 28 slices shown (1 of 2)]
[im 1/28]
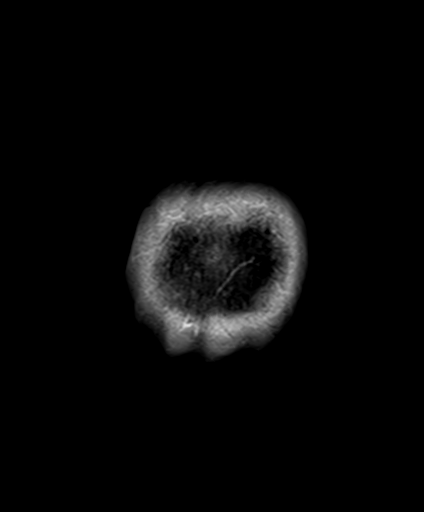
[im 28/28]
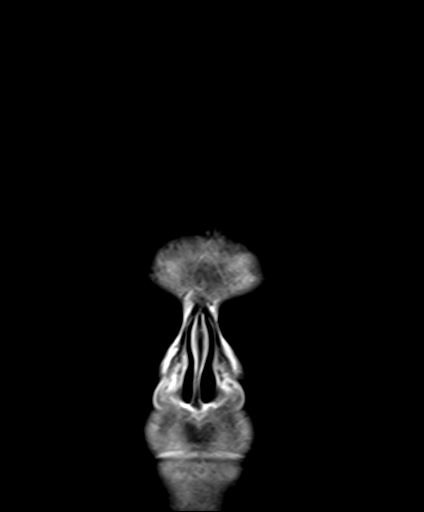

[Series 13: T1 post-contrast · sagittal · 5.0mm · 0.45mm/px · 2 of 23 slices shown (2 of 2)]
[im 1/23]
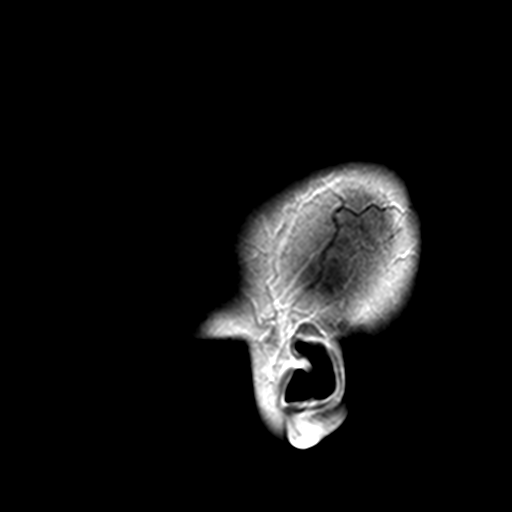
[im 23/23]
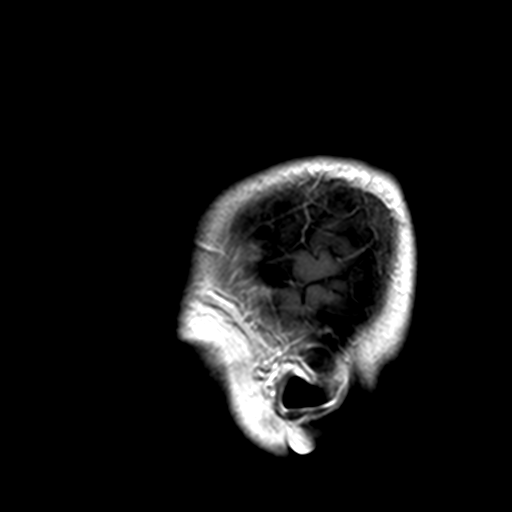

[Series 100: hx · sagittal · 5.0mm · 0.45mm/px · 2 of 23 slices shown]
[im 1/23]
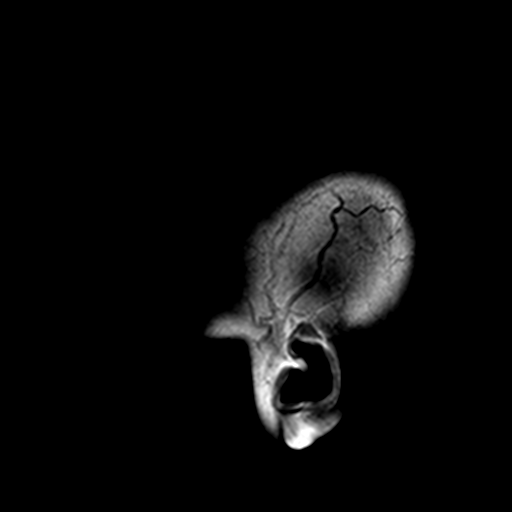
[im 23/23]
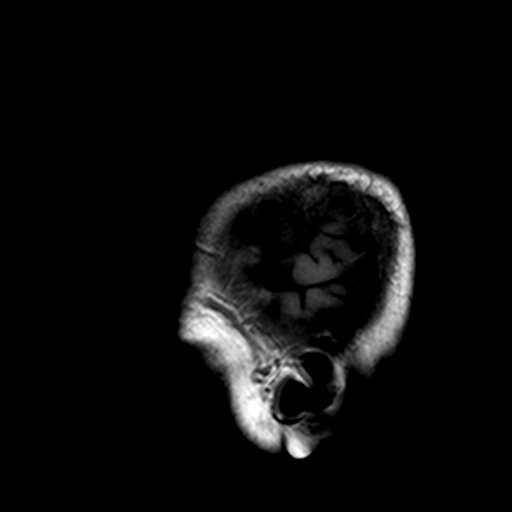

[38 of 48 positions shown; findings below may reference images not displayed]

FINDINGS: MRI HEAD FINDINGS

Brain: Diffusion imaging does not show any acute or subacute
infarction. There chronic small-vessel ischemic changes of the pons.
No focal cerebellar insult. Cerebral hemispheres show mild chronic
small-vessel ischemic changes of the deep and subcortical white
matter. No cortical or large vessel territory infarction. No mass
lesion, hemorrhage, hydrocephalus or extra-axial collection. After
contrast administration, no abnormal enhancement occurs.

Vascular: Major vessels at the base of the brain show flow.

Skull and upper cervical spine: Asymmetric benign thickening of the
left parietal calvarium, possibly secondary to fibrous dysplasia.

Sinuses/Orbits: Clear/normal

Other: None

MRA HEAD FINDINGS

Both internal carotid arteries are widely patent through the skull
base and siphon regions. The anterior and middle cerebral vessels
are patent without proximal stenosis, aneurysm or vascular
malformation.

Both vertebral arteries are patent at the foramen magnum. The right
vertebral artery is dominant and gives majority supply the basilar.
The left vertebral artery supplies PICA and is patent only as a
small vessel beyond that. No basilar stenosis. Posterior circulation
branch vessels are patent.
IMPRESSION: No acute or subacute finding. No specific cause left hand numbness
and weakness is identified. Mild chronic small-vessel ischemic
change of the cerebral hemispheric white matter.

Negative intracranial MR angiography of the large and medium size
vessels. The left vertebral artery supplies PICA and is then patent
as a very small vessel to the basilar. This is presumed to represent
the patient's developmental anatomy. Difficult to completely exclude
narrowing of the distal left vertebral artery secondary to
atherosclerotic disease, but that is not favored.

## 2020-10-25 ENCOUNTER — Other Ambulatory Visit: Payer: Self-pay

## 2020-10-25 ENCOUNTER — Ambulatory Visit (INDEPENDENT_AMBULATORY_CARE_PROVIDER_SITE_OTHER): Payer: Self-pay | Admitting: Osteopathic Medicine

## 2020-10-25 VITALS — BP 162/99 | HR 112 | Temp 98.2°F | Wt 144.0 lb

## 2020-10-25 DIAGNOSIS — I10 Essential (primary) hypertension: Secondary | ICD-10-CM

## 2020-10-25 DIAGNOSIS — E871 Hypo-osmolality and hyponatremia: Secondary | ICD-10-CM

## 2020-10-25 DIAGNOSIS — R972 Elevated prostate specific antigen [PSA]: Secondary | ICD-10-CM

## 2020-10-25 DIAGNOSIS — R42 Dizziness and giddiness: Secondary | ICD-10-CM

## 2020-10-25 DIAGNOSIS — R748 Abnormal levels of other serum enzymes: Secondary | ICD-10-CM

## 2020-10-25 DIAGNOSIS — F411 Generalized anxiety disorder: Secondary | ICD-10-CM

## 2020-10-25 LAB — CBC
HCT: 52 % — ABNORMAL HIGH (ref 38.5–50.0)
MCHC: 34.2 g/dL (ref 32.0–36.0)
MPV: 10.7 fL (ref 7.5–12.5)

## 2020-10-25 MED ORDER — METOPROLOL SUCCINATE ER 50 MG PO TB24: 50.0000 mg | ORAL_TABLET | Freq: Every day | ORAL | 1 refills | Status: DC

## 2020-10-25 NOTE — Patient Instructions (Addendum)
Restart BP meds w metoprolol 50 mg once per day  BP goal <130/80  Might need to increase to 100 mg  Labs here ASAP

## 2020-10-25 NOTE — Progress Notes (Signed)
Jeff Aguirre is a 60 y.o. male who presents to  [place] at [facility]  today, 10/25/20, seeking care for the following:  . Dizzy spells: 2 weeks ago he stood up and felt very lightheaded and dizzy, after a few days this went away but recurred last week. At tthe time he checked hisi BP and it was elevated 160/100+, no headaches/vision change, no weakness or facial droop/clurred speech, no room-spinning sensation.  . Barriers to follow-up on other chronic issues as below: lost insurance in 2020     [place] with other pertinent findings:  The primary encounter diagnosis was Dizziness. Diagnoses of Essential hypertension, Elevated liver enzymes, Hyponatremia, Generalized anxiety disorder, Elevated PSA, less than 10 ng/ml, and Hemochromatosis, unspecified hemochromatosis type were also pertinent to this visit.   Orthostatic VS do show a drop in BP and increase in HR  Pt has been out of meds or some time  Will restart Rx, beta blocker for tachycardia and BP May consider adding ACE/ARB pending labs / BP at home  Pt declined restart SSRI Ok to hold off on statin for now given elevated liver enzymes Labs as below  ER precautions reviewed   Patient Instructions  Restart BP meds w metoprolol 50 mg once per day  BP goal <130/80  Might need to increase to 100 mg  Labs here ASAP     Orders Placed This Encounter  Procedures  . COMPLETE METABOLIC PANEL WITH GFR  . TSH  . CBC  . PSA    Meds ordered this encounter  Medications  . metoprolol succinate (TOPROL-XL) 50 MG 24 hr tablet    Sig: Take 1 tablet (50 mg total) by mouth daily.    Dispense:  30 tablet    Refill:  1     See below for relevant physical exam findings  See below for recent lab and imaging results reviewed  Medications, allergies, PMH, PSH, SocH, FamH reviewed below    Follow-up instructions: Return for RECHECK PENDING RESULTS / IF WORSE OR  CHANGE.                                        Exam:  BP (!) 162/99 (BP Location: Left Arm, Patient Position: Sitting, Cuff Size: Normal)   Pulse (!) 112   Temp 98.2 F (36.8 C) (Oral)   Wt 144 lb 0.6 oz (65.3 kg)   BMI 20.67 kg/m   Constitutional: VS see above. General Appearance: alert, well-developed, well-nourished, NAD  Neck: No masses, trachea midline.   Respiratory: Normal respiratory effort. no wheeze, no rhonchi, no rales  Cardiovascular: S1/S2 normal, no murmur, no rub/gallop auscultated. RRR.   Musculoskeletal: Gait normal. Symmetric and independent movement of all extremities  Abdominal: non-tender, non-distended  Neurological: Normal balance/coordination. No tremor.  Skin: warm, dry, intact.   Psychiatric: Normal judgment/insight. Normal mood and affect. Oriented x3.   Current Meds  Medication Sig  . metoprolol succinate (TOPROL-XL) 50 MG 24 hr tablet Take 1 tablet (50 mg total) by mouth daily.    Allergies  Allergen Reactions  . Morphine Itching    REACTION: itching    Patient Active Problem List   Diagnosis Date Noted  . Elevated PSA, less than 10 ng/ml 02/03/2018  . Generalized anxiety disorder 05/06/2016  . Hyponatremia 04/10/2016  . Elevated liver enzymes 04/10/2016  . Essential hypertension  02/18/2016  . Anxiety state 02/18/2016  . Panic anxiety syndrome 02/18/2016  . Adenomatous polyps 02/23/2012  . Active smoker 10/17/2011  . URI 07/03/2008    Family History  Problem Relation Age of Onset  . Diabetes Mother   . Hypertension Mother   . Cancer Mother        breast  . Diabetes Father   . Hypertension Father   . Heart disease Father   . Diabetes Sister   . Hypertension Sister   . Diabetes Brother   . Hypertension Brother     Social History   Tobacco Use  Smoking Status Current Every Day Smoker  . Packs/day: 0.50  . Years: 35.00  . Pack years: 17.50  . Types: Cigarettes  Smokeless  Tobacco Former Systems developer  . Types: Chew  . Quit date: 05/26/1976    Past Surgical History:  Procedure Laterality Date  . growth on testicle removed  2000's   benign  . LUNG SURGERY      Immunization History  Administered Date(s) Administered  . Influenza,inj,Quad PF,6+ Mos 07/06/2017, 02/10/2018  . Tdap 10/15/2011    No results found for this or any previous visit (from the past 2160 hour(s)).  No results found.     All questions at time of visit were answered - patient instructed to contact office with any additional concerns or updates. ER/RTC precautions were reviewed with the patient as applicable.   Please note: manual typing as well as voice recognition software may have been used to produce this document - typos may escape review. Please contact Dr. Sheppard Coil for any needed clarifications.   Total encounter time on date of service, 10/25/20, was 20 minutes spent addressing problems/issues as noted above in [place], including time spent in discussion with patient regarding the HPI, ROS, confirming history, reviewing Assessment & Plan, as well as time spent on coordination of care, record review.

## 2020-10-26 LAB — CBC
Hemoglobin: 17.8 g/dL — ABNORMAL HIGH (ref 13.2–17.1)
MCH: 34 pg — ABNORMAL HIGH (ref 27.0–33.0)
MCV: 99.4 fL (ref 80.0–100.0)
Platelets: 212 10*3/uL (ref 140–400)
RBC: 5.23 10*6/uL (ref 4.20–5.80)
RDW: 12.8 % (ref 11.0–15.0)
WBC: 6.4 10*3/uL (ref 3.8–10.8)

## 2020-10-26 LAB — COMPLETE METABOLIC PANEL WITH GFR
AG Ratio: 1.8 (calc) (ref 1.0–2.5)
ALT: 20 U/L (ref 9–46)
AST: 29 U/L (ref 10–35)
Albumin: 4.5 g/dL (ref 3.6–5.1)
Alkaline phosphatase (APISO): 86 U/L (ref 35–144)
BUN/Creatinine Ratio: 8 (calc) (ref 6–22)
BUN: 5 mg/dL — ABNORMAL LOW (ref 7–25)
CO2: 29 mmol/L (ref 20–32)
Calcium: 10 mg/dL (ref 8.6–10.3)
Chloride: 101 mmol/L (ref 98–110)
Creat: 0.64 mg/dL — ABNORMAL LOW (ref 0.70–1.25)
GFR, Est African American: 122 mL/min/{1.73_m2} (ref >=60)
GFR, Est Non African American: 105 mL/min/{1.73_m2} (ref >=60)
Globulin: 2.5 g/dL (calc) (ref 1.9–3.7)
Glucose, Bld: 87 mg/dL (ref 65–99)
Potassium: 4.7 mmol/L (ref 3.5–5.3)
Sodium: 139 mmol/L (ref 135–146)
Total Bilirubin: 0.4 mg/dL (ref 0.2–1.2)
Total Protein: 7 g/dL (ref 6.1–8.1)

## 2020-10-26 LAB — TSH: TSH: 0.99 mIU/L (ref 0.40–4.50)

## 2020-10-26 LAB — PSA: PSA: 2.89 ng/mL (ref <=4.00)

## 2020-11-02 ENCOUNTER — Telehealth: Payer: Self-pay | Admitting: Osteopathic Medicine

## 2020-11-02 NOTE — Telephone Encounter (Signed)
As per requested from provider - contacted patient regarding blood pressure readings. Per patient, he has been taking the rx every morning consistently. He has seen improvement in his bp readings.   137/90 120's/upper 80's 112/76 (yesterday) 108/77 (today)

## 2020-11-02 NOTE — Telephone Encounter (Signed)
-----   Message from Emeterio Reeve, DO sent at 10/25/2020  7:30 AM EDT ----- Call: what are BP numbers looking like on metoprolol 50 mg?

## 2020-11-03 ENCOUNTER — Encounter: Payer: Self-pay | Admitting: Osteopathic Medicine

## 2020-12-24 ENCOUNTER — Other Ambulatory Visit: Payer: Self-pay | Admitting: Osteopathic Medicine

## 2021-01-14 ENCOUNTER — Ambulatory Visit (INDEPENDENT_AMBULATORY_CARE_PROVIDER_SITE_OTHER): Payer: Self-pay | Admitting: Family Medicine

## 2021-01-14 ENCOUNTER — Other Ambulatory Visit: Payer: Self-pay

## 2021-01-14 ENCOUNTER — Encounter: Payer: Self-pay | Admitting: Family Medicine

## 2021-01-14 VITALS — BP 144/84 | HR 77 | Temp 97.7°F | Resp 17

## 2021-01-14 DIAGNOSIS — J40 Bronchitis, not specified as acute or chronic: Secondary | ICD-10-CM

## 2021-01-14 DIAGNOSIS — J329 Chronic sinusitis, unspecified: Secondary | ICD-10-CM

## 2021-01-14 MED ORDER — AMOXICILLIN-POT CLAVULANATE 875-125 MG PO TABS: 1.0000 | ORAL_TABLET | Freq: Two times a day (BID) | ORAL | 0 refills | Status: AC

## 2021-01-14 NOTE — Patient Instructions (Signed)
Over the counter medications that may be helpful for symptoms:  Guaifenesin 1200 mg extended release tabs twice daily, with plenty of water For cough and congestion Brand name: Mucinex   Pseudoephedrine 30 mg, one or two tabs every 4 to 6 hours For sinus congestion Brand name: Sudafed You must get this from the pharmacy counter.  Oxymetazoline nasal spray each morning, one spray in each nostril, for NO MORE THAN 3 days  For nasal and sinus congestion Brand name: Afrin Saline nasal spray or Saline Nasal Irrigation 3-5 times a day For nasal and sinus congestion Brand names: Ocean or AYR Fluticasone nasal spray, one spray in each nostril, each morning (after oxymetazoline and saline, if used) For nasal and sinus congestion Brand name: Flonase Warm salt water gargles  For sore throat Every few hours as needed Alternate ibuprofen 400-600 mg and acetaminophen 1000 mg every 4-6 hours For fever, body aches, headache Brand names: Motrin or Advil and Tylenol Dextromethorphan 12-hour cough version 30 mg every 12 hours  For cough Brand name: Delsym Stop all other cold medications for now (Nyquil, Dayquil, Tylenol Cold, Theraflu, etc) and other non-prescription cough/cold preparations. Many of these have the same ingredients listed above and could cause an overdose of medication.    If you develop severe shortness of breath, uncontrolled fevers, coughing up blood, confusion, chest pain, or signs of dehydration (such as significantly decreased urine amounts or dizziness with standing) please go to the ER.

## 2021-01-14 NOTE — Progress Notes (Signed)
Acute Office Visit  Subjective:    Patient ID: Jeff Aguirre, male    DOB: 05/17/1961, 60 y.o.   MRN: [id number]  Chief Complaint  Patient presents with   URI    HPI Patient is in today for cough and cold symptoms.    Patient states about 3 weeks ago he woke up with a pounding headache, nasal congestion, chest congestion, cough sinus pressure.  Symptoms have continued.  States he is now coughing up white, cloudy sputum.  Sinus pressure that is usually worse in the morning and then clears up until evening time when it worsens again.  He is having rhinorrhea, nasal congestion, minimal decrease in taste and smell.  He has not tested for COVID but has not of 1 day.  He has been slightly more fatigued than normal but nothing that has been concerning to him.  He lives alone and has had no known sick contacts. So far he has only been taking cough drop for his symptoms as he does not like medicine.  He denies any sore throat, ear pain, shortness of breath, chest pain, wheezing, nausea, vomiting, diarrhea, fevers, body aches.   Reports he had walking pneumonia in the '80s. No known sinus infections. No seasonal allergies. States he is pretty healthy overall.     Past Medical History:  Diagnosis Date   Anxiety    Colon polyps    Hypertension     Past Surgical History:  Procedure Laterality Date   growth on testicle removed  2000's   benign   LUNG SURGERY      Family History  Problem Relation Age of Onset   Diabetes Mother    Hypertension Mother    Cancer Mother        breast   Diabetes Father    Hypertension Father    Heart disease Father    Diabetes Sister    Hypertension Sister    Diabetes Brother    Hypertension Brother     Social History   Socioeconomic History   Marital status: Legally Separated    Spouse name: Not on file   Number of children: 1   Years of education: 12   Highest education level: Not on file  Occupational History   Occupation: Freight forwarder   Tobacco Use   Smoking status: Every Day    Packs/day: 0.50    Years: 35.00    Pack years: 17.50    Types: Cigarettes   Smokeless tobacco: Former    Types: Chew    Quit date: 05/26/1976  Vaping Use   Vaping Use: Never used  Substance and Sexual Activity   Alcohol use: Yes    Alcohol/week: 12.0 - 18.0 standard drinks    Types: 12 - 18 Cans of beer per week   Drug use: No   Sexual activity: Not on file  Other Topics Concern   Not on file  Social History Narrative   Lives alone in an apartment on the 2nd floor.  No children.  Works as a Freight forwarder for [employer].  Education: high school.    Social Determinants of Health   Financial Resource Strain: Not on file  Food Insecurity: Not on file  Transportation Needs: Not on file  Physical Activity: Not on file  Stress: Not on file  Social Connections: Not on file  Intimate Partner Violence: Not on file    Outpatient Medications Prior to Visit  Medication Sig Dispense Refill   metoprolol succinate (TOPROL-XL)  50 MG 24 hr tablet Take 1 tablet by mouth once daily 90 tablet 1   No facility-administered medications prior to visit.    Allergies  Allergen Reactions   Morphine Itching    REACTION: itching    Review of Systems All review of systems negative except what is listed in the HPI     Objective:    Physical Exam Vitals reviewed.  Constitutional:      Appearance: Normal appearance.  HENT:     Head: Normocephalic and atraumatic.     Right Ear: Tympanic membrane normal.     Left Ear: Tympanic membrane normal.     Nose: Congestion and rhinorrhea present.     Mouth/Throat:     Mouth: Mucous membranes are moist.     Pharynx: Oropharynx is clear.  Cardiovascular:     Rate and Rhythm: Normal rate and regular rhythm.     Heart sounds: Normal heart sounds.  Pulmonary:     Effort: Pulmonary effort is normal.     Comments: Mild congestion/rhonchi that cleared with coughing Musculoskeletal:     Cervical  back: Normal range of motion.  Lymphadenopathy:     Cervical: No cervical adenopathy.  Skin:    General: Skin is warm and dry.     Capillary Refill: Capillary refill takes less than 2 seconds.     Findings: No rash.  Neurological:     Mental Status: He is alert and oriented to person, place, and time.  Psychiatric:        Mood and Affect: Mood normal.        Behavior: Behavior normal.        Thought Content: Thought content normal.        Judgment: Judgment normal.    BP (!) 144/84   Pulse 77   Temp 97.7 F (36.5 C)   Resp 17   SpO2 95%  Wt Readings from Last 3 Encounters:  10/25/20 144 lb 0.6 oz (65.3 kg)  02/16/18 156 lb (70.8 kg)  02/10/18 154 lb 11.2 oz (70.2 kg)    Health Maintenance Due  Topic Date Due   COVID-19 Vaccine (1) Never done   Pneumococcal Vaccine 26-10 Years old (1 - PCV) Never done   HIV Screening  Never done   COLONOSCOPY (Pts 45-30yr Insurance coverage will need to be confirmed)  06/17/2017   INFLUENZA VACCINE  12/24/2020    There are no preventive care reminders to display for this patient.   Lab Results  Component Value Date   TSH 0.99 10/25/2020   Lab Results  Component Value Date   WBC 6.4 10/25/2020   HGB 17.8 (H) 10/25/2020   HCT 52.0 (H) 10/25/2020   MCV 99.4 10/25/2020   PLT 212 10/25/2020   Lab Results  Component Value Date   NA 139 10/25/2020   K 4.7 10/25/2020   CO2 29 10/25/2020   GLUCOSE 87 10/25/2020   BUN 5 (L) 10/25/2020   CREATININE 0.64 (L) 10/25/2020   BILITOT 0.4 10/25/2020   ALKPHOS 97 04/08/2016   AST 29 10/25/2020   ALT 20 10/25/2020   PROT 7.0 10/25/2020   ALBUMIN 4.7 04/08/2016   CALCIUM 10.0 10/25/2020   Lab Results  Component Value Date   CHOL 162 02/01/2018   Lab Results  Component Value Date   HDL 89 02/01/2018   Lab Results  Component Value Date   LDLCALC 60 02/01/2018   Lab Results  Component Value Date   TRIG 58 02/01/2018  Lab Results  Component Value Date   CHOLHDL 1.8  02/01/2018   No results found for: HGBA1C     Assessment & Plan:   1. Sinobronchitis Given duration, will go ahead and treat with Augmentin. Can try OTC options for symptom management - list added to AVS. Recommend rest, hydration, humidifier use, and pulmonary hygiene discussed. Patient aware of signs/symptoms requiring further/urgent evaluation.  - amoxicillin-clavulanate (AUGMENTIN) 875-125 MG tablet; Take 1 tablet by mouth 2 (two) times daily for 7 days.  Dispense: 14 tablet; Refill: 0  Follow-up if symptoms worsen or fail to improve.   Purcell Nails Olevia Bowens, DNP, FNP-C

## 2021-08-16 ENCOUNTER — Other Ambulatory Visit: Payer: Self-pay | Admitting: Osteopathic Medicine

## 2021-12-02 ENCOUNTER — Ambulatory Visit (INDEPENDENT_AMBULATORY_CARE_PROVIDER_SITE_OTHER): Payer: Self-pay | Admitting: Family Medicine

## 2021-12-02 ENCOUNTER — Encounter: Payer: Self-pay | Admitting: Family Medicine

## 2021-12-02 VITALS — BP 143/87 | HR 76 | Ht 70.0 in | Wt 146.0 lb

## 2021-12-02 DIAGNOSIS — R7989 Other specified abnormal findings of blood chemistry: Secondary | ICD-10-CM

## 2021-12-02 DIAGNOSIS — D582 Other hemoglobinopathies: Secondary | ICD-10-CM

## 2021-12-02 DIAGNOSIS — I10 Essential (primary) hypertension: Secondary | ICD-10-CM

## 2021-12-02 MED ORDER — METOPROLOL SUCCINATE ER 50 MG PO TB24: 50.0000 mg | ORAL_TABLET | Freq: Every day | ORAL | 1 refills | Status: DC

## 2021-12-02 NOTE — Assessment & Plan Note (Signed)
Blood pressure is not at goal for age and co-morbidities.  I recommend continuing daily metoprolol and monitor at home for the next 2 weeks.   - BP goal <130/80 - monitor and log blood pressures at home - check around the same time each day in a relaxed setting - Limit salt to <2000 mg/day - Follow DASH eating plan (heart healthy diet) - limit alcohol to 2 standard drinks per day for men and 1 per day for women - avoid tobacco products - get at least 2 hours of regular aerobic exercise weekly Patient aware of signs/symptoms requiring further/urgent evaluation. Labs updated today. Send a MyChart message in about 2 weeks with your daily readings so we can make med adjustments if needed.

## 2021-12-02 NOTE — Progress Notes (Signed)
New Patient Office Visit  Subjective    Patient ID: Jeff Aguirre, male    DOB: [birth year]  Age: 61 y.o. MRN: [id number]  CC: establish care    HPI [name] presents to establish care. He was previously with Dr. Emeterio Reeve at [facility].   He is feeling well overall. No major concerns. He is retired from [employer]. He does not currently have insurance. Prefers to wait on colonoscopy and health maintenance activities until he tries to get Medicare coverage in a few years.    HYPERTENSION: - Medications: metoprolol succinate 50 mg daily  - Compliance: good usually, he was starting to run and cut back to every other day to make it last longer - Checking BP at home: not regularly - Denies any SOB, recurrent headaches, CP, vision changes, LE edema, dizziness, palpitations, or medication side effects. - Diet: somewhat low-sodium diet, low-carb - Exercise: none      Outpatient Encounter Medications as of 12/02/2021  Medication Sig   metoprolol succinate (TOPROL-XL) 50 MG 24 hr tablet Take 1 tablet (50 mg total) by mouth daily.   [DISCONTINUED] metoprolol succinate (TOPROL-XL) 50 MG 24 hr tablet Take 1 tablet (50 mg total) by mouth daily. LAST REFILL. NEEDS TO TRANSFER CARE TO NEW PCP   No facility-administered encounter medications on file as of 12/02/2021.    Past Medical History:  Diagnosis Date   Anxiety    Colon polyps    Hypertension     Past Surgical History:  Procedure Laterality Date   growth on testicle removed  2000's   benign   LUNG SURGERY      Family History  Problem Relation Age of Onset   Diabetes Mother    Hypertension Mother    Cancer Mother        breast   Diabetes Father    Hypertension Father    Heart disease Father    Diabetes Sister    Hypertension Sister    Diabetes Brother    Hypertension Brother     Social History   Socioeconomic History   Marital status: Widowed    Spouse name:  Not on file   Number of children: 1   Years of education: 48   Highest education level: Not on file  Occupational History   Occupation: Freight forwarder  Tobacco Use   Smoking status: Every Day    Packs/day: 0.50    Years: 35.00    Total pack years: 17.50    Types: Cigarettes   Smokeless tobacco: Former    Types: Chew    Quit date: 05/26/1976  Vaping Use   Vaping Use: Never used  Substance and Sexual Activity   Alcohol use: Yes    Alcohol/week: 12.0 - 18.0 standard drinks of alcohol    Types: 12 - 18 Cans of beer per week   Drug use: No   Sexual activity: Yes    Birth control/protection: None  Other Topics Concern   Not on file  Social History Narrative   Lives alone in an apartment on the 2nd floor.  No children.  Works as a Freight forwarder for [employer].  Education: high school.    Social Determinants of Health   Financial Resource Strain: Not on file  Food Insecurity: Not on file  Transportation Needs: Not on file  Physical Activity: Not on file  Stress: Not on file  Social Connections: Not on file  Intimate Partner Violence: Not  on file    ROS All review of systems negative except what is listed in the HPI      Objective    BP (!) 143/87   Pulse 76   Ht '5\' 10"'$  (1.778 m)   Wt 146 lb (66.2 kg)   BMI 20.95 kg/m   Physical Exam Vitals reviewed.  Constitutional:      Appearance: Normal appearance.  Cardiovascular:     Rate and Rhythm: Normal rate and regular rhythm.  Pulmonary:     Effort: Pulmonary effort is normal.     Breath sounds: Normal breath sounds.  Musculoskeletal:     Right lower leg: No edema.     Left lower leg: No edema.  Skin:    General: Skin is warm and dry.  Neurological:     General: No focal deficit present.     Mental Status: He is alert and oriented to person, place, and time. Mental status is at baseline.  Psychiatric:        Mood and Affect: Mood normal.        Behavior: Behavior normal.        Thought Content: Thought  content normal.        Judgment: Judgment normal.            Assessment & Plan:   Problem List Items Addressed This Visit       Cardiovascular and Mediastinum   Essential hypertension - Primary    Blood pressure is not at goal for age and co-morbidities.  I recommend continuing daily metoprolol and monitor at home for the next 2 weeks.   - BP goal <130/80 - monitor and log blood pressures at home - check around the same time each day in a relaxed setting - Limit salt to <2000 mg/day - Follow DASH eating plan (heart healthy diet) - limit alcohol to 2 standard drinks per day for men and 1 per day for women - avoid tobacco products - get at least 2 hours of regular aerobic exercise weekly Patient aware of signs/symptoms requiring further/urgent evaluation. Labs updated today. Send a MyChart message in about 2 weeks with your daily readings so we can make med adjustments if needed.       Relevant Medications   metoprolol succinate (TOPROL-XL) 50 MG 24 hr tablet   Other Relevant Orders   CBC   Comprehensive metabolic panel   Lipid panel    Return in about 6 months (around 06/04/2022) for routine f/u .   Terrilyn Saver, NP

## 2021-12-02 NOTE — Patient Instructions (Addendum)
Blood pressure is not at goal for age and co-morbidities.  I recommend continuing daily metoprolol and monitor at home for the next 2 weeks.   - BP goal <130/80 - monitor and log blood pressures at home - check around the same time each day in a relaxed setting - Limit salt to <2000 mg/day - Follow DASH eating plan (heart healthy diet) - limit alcohol to 2 standard drinks per day for men and 1 per day for women - avoid tobacco products - get at least 2 hours of regular aerobic exercise weekly Patient aware of signs/symptoms requiring further/urgent evaluation. Labs updated today. Send a MyChart message in about 2 weeks with your daily readings so we can make med adjustments if needed.

## 2021-12-03 ENCOUNTER — Telehealth: Payer: Self-pay

## 2021-12-03 LAB — COMPREHENSIVE METABOLIC PANEL
ALT: 42 U/L (ref 0–53)
AST: 52 U/L — ABNORMAL HIGH (ref 0–37)
Albumin: 4.6 g/dL (ref 3.5–5.2)
Alkaline Phosphatase: 98 U/L (ref 39–117)
BUN: 5 mg/dL — ABNORMAL LOW (ref 6–23)
CO2: 30 mEq/L (ref 19–32)
Calcium: 9.6 mg/dL (ref 8.4–10.5)
Chloride: 98 mEq/L (ref 96–112)
Creatinine, Ser: 0.71 mg/dL (ref 0.40–1.50)
GFR: 97.76 mL/min (ref >60.00)
Glucose, Bld: 88 mg/dL (ref 70–99)
Potassium: 4.4 mEq/L (ref 3.5–5.1)
Sodium: 135 mEq/L (ref 135–145)
Total Bilirubin: 1.2 mg/dL (ref 0.2–1.2)
Total Protein: 7 g/dL (ref 6.0–8.3)

## 2021-12-03 LAB — LIPID PANEL
Cholesterol: 127 mg/dL (ref 0–200)
HDL: 66 mg/dL (ref >39.00)
LDL Cholesterol: 48 mg/dL (ref 0–99)
NonHDL: 61.35
Total CHOL/HDL Ratio: 2
Triglycerides: 67 mg/dL (ref 0.0–149.0)
VLDL: 13.4 mg/dL (ref 0.0–40.0)

## 2021-12-03 LAB — CBC
HCT: 54.5 % — ABNORMAL HIGH (ref 39.0–52.0)
Hemoglobin: 18.4 g/dL (ref 13.0–17.0)
MCHC: 33.8 g/dL (ref 30.0–36.0)
MCV: 101.2 fl — ABNORMAL HIGH (ref 78.0–100.0)
Platelets: 186 10*3/uL (ref 150.0–400.0)
RBC: 5.39 Mil/uL (ref 4.22–5.81)
RDW: 15.3 % (ref 11.5–15.5)
WBC: 8.1 10*3/uL (ref 4.0–10.5)

## 2021-12-03 NOTE — Addendum Note (Signed)
Addended by: Caleen Jobs B on: 12/03/2021 12:28 PM   Modules accepted: Orders

## 2021-12-03 NOTE — Telephone Encounter (Signed)
CRITICAL VALUE STICKER  CRITICAL VALUE: Hemoglobin 18.4   RECEIVER (on-site recipient of call): Manuela Schwartz, [place] NOTIFIED: 12/03/2021 at 10:51 am   MESSENGER (representative from lab): Delorise Jackson   MD NOTIFIED: Caleen Jobs, NP  TIME OF NOTIFICATION: 10:54 am   RESPONSE:

## 2021-12-17 ENCOUNTER — Encounter: Payer: Self-pay | Admitting: Family Medicine

## 2022-05-26 ENCOUNTER — Other Ambulatory Visit: Payer: Self-pay | Admitting: Family Medicine

## 2022-05-26 DIAGNOSIS — I10 Essential (primary) hypertension: Secondary | ICD-10-CM

## 2022-06-04 ENCOUNTER — Ambulatory Visit: Payer: Self-pay | Admitting: Family Medicine

## 2022-06-06 ENCOUNTER — Encounter: Payer: Self-pay | Admitting: Family Medicine

## 2022-06-06 ENCOUNTER — Ambulatory Visit (INDEPENDENT_AMBULATORY_CARE_PROVIDER_SITE_OTHER): Payer: Self-pay | Admitting: Family Medicine

## 2022-06-06 ENCOUNTER — Telehealth: Payer: Self-pay

## 2022-06-06 VITALS — BP 135/87 | HR 71 | Temp 98.0°F | Resp 16 | Ht 70.0 in | Wt 156.8 lb

## 2022-06-06 DIAGNOSIS — I10 Essential (primary) hypertension: Secondary | ICD-10-CM

## 2022-06-06 DIAGNOSIS — D582 Other hemoglobinopathies: Secondary | ICD-10-CM

## 2022-06-06 LAB — COMPREHENSIVE METABOLIC PANEL
ALT: 44 U/L (ref 0–53)
AST: 49 U/L — ABNORMAL HIGH (ref 0–37)
Albumin: 4 g/dL (ref 3.5–5.2)
Alkaline Phosphatase: 107 U/L (ref 39–117)
BUN: 5 mg/dL — ABNORMAL LOW (ref 6–23)
CO2: 29 mEq/L (ref 19–32)
Calcium: 9 mg/dL (ref 8.4–10.5)
Chloride: 100 mEq/L (ref 96–112)
Creatinine, Ser: 0.74 mg/dL (ref 0.40–1.50)
GFR: 96.2 mL/min (ref >60.00)
Glucose, Bld: 122 mg/dL — ABNORMAL HIGH (ref 70–99)
Potassium: 4.5 mEq/L (ref 3.5–5.1)
Sodium: 136 mEq/L (ref 135–145)
Total Bilirubin: 0.5 mg/dL (ref 0.2–1.2)
Total Protein: 6.5 g/dL (ref 6.0–8.3)

## 2022-06-06 LAB — CBC
HCT: 56.5 % — ABNORMAL HIGH (ref 39.0–52.0)
Hemoglobin: 19.3 g/dL (ref 13.0–17.0)
MCHC: 34.2 g/dL (ref 30.0–36.0)
MCV: 103.7 fl — ABNORMAL HIGH (ref 78.0–100.0)
Platelets: 216 10*3/uL (ref 150.0–400.0)
RBC: 5.45 Mil/uL (ref 4.22–5.81)
RDW: 14 % (ref 11.5–15.5)
WBC: 5.5 10*3/uL (ref 4.0–10.5)

## 2022-06-06 NOTE — Assessment & Plan Note (Signed)
Blood pressure is at goal for age and co-morbidities.   Recommendations: continue metoprolol 50 mg daily - BP goal <130/80 - monitor and log blood pressures at home - check around the same time each day in a relaxed setting - Limit salt to <2000 mg/day - Follow DASH eating plan (heart healthy diet) - limit alcohol to 2 standard drinks per day for men and 1 per day for women - avoid tobacco products - get at least 2 hours of regular aerobic exercise weekly Patient aware of signs/symptoms requiring further/urgent evaluation. Labs updated today.

## 2022-06-06 NOTE — Patient Instructions (Signed)
Blood pressure is at goal for age and co-morbidities.   Recommendations: continue metoprolol 50 mg daily - BP goal <130/80 - monitor and log blood pressures at home - check around the same time each day in a relaxed setting - Limit salt to <2000 mg/day - Follow DASH eating plan (heart healthy diet) - limit alcohol to 2 standard drinks per day for men and 1 per day for women - avoid tobacco products - get at least 2 hours of regular aerobic exercise weekly Patient aware of signs/symptoms requiring further/urgent evaluation. Labs updated today.

## 2022-06-06 NOTE — Telephone Encounter (Signed)
CRITICAL VALUE STICKER  CRITICAL VALUE: Hemoglobin 19.3  RECEIVER (on-site recipient of call): Petra Kuba  DATE & TIME NOTIFIED: 06/06/2022 1:30PM  MESSENGER (representative from lab): Santiago Glad   MD NOTIFIED: : Caleen Jobs notified verbally   TIME OF NOTIFICATION: 1:35PM  RESPONSE:

## 2022-06-06 NOTE — Telephone Encounter (Signed)
Called Pt left a VM to call the office back to go over labs.

## 2022-06-06 NOTE — Progress Notes (Signed)
Established Patient Office Visit  Subjective   Patient ID: Jeff Aguirre, male    DOB: Dec 31, 1960  Age: 62 y.o. MRN: [id number]  Chief Complaint  Patient presents with   6 months follow up    HPI  Patient is here for 67-monthfollow-up. Reports he has been feeling really well and has been working on quitting smoking.   Hypertension: - Medications: metoprolol succinate 50 mg daily  - Compliance: good - Checking BP at home: rarely - Denies any SOB, recurrent headaches, CP, vision changes, LE edema, dizziness, palpitations, or medication side effects. - Diet: general, trying to eat heart healthy - Exercise: not regularly - Smoking: reports he has cut back to 1 pack lasting about 4 days now and trying to quit  At last visit in July he had elevated Hgb and liver enzymes. He was instructed to come for recheck after staying well hydrated, cutting back on smoking, and minimizing tylenol an alcohol prior to visit. He did not return for repeat labs. He is okay to have these drawn today, but not currently fasting.       ROS All review of systems negative except what is listed in the HPI    Objective:     BP 135/87   Pulse 71   Temp 98 F (36.7 C)   Resp 16   Ht '5\' 10"'$  (1.778 m)   Wt 156 lb 12.8 oz (71.1 kg)   SpO2 95%   BMI 22.50 kg/m    Physical Exam Vitals reviewed.  Constitutional:      Appearance: Normal appearance.  Cardiovascular:     Rate and Rhythm: Normal rate and regular rhythm.  Pulmonary:     Effort: Pulmonary effort is normal.     Breath sounds: Normal breath sounds.  Musculoskeletal:     Right lower leg: No edema.     Left lower leg: No edema.  Skin:    General: Skin is warm and dry.  Neurological:     General: No focal deficit present.     Mental Status: He is alert and oriented to person, place, and time. Mental status is at baseline.  Psychiatric:        Mood and Affect: Mood normal.        Behavior: Behavior normal.        Thought Content:  Thought content normal.        Judgment: Judgment normal.        No results found for any visits on 06/06/22.    The ASCVD Risk score (Arnett DK, et al., 2019) failed to calculate for the following reasons:   The valid total cholesterol range is 130 to 320 mg/dL    Assessment & Plan:   Problem List Items Addressed This Visit       Cardiovascular and Mediastinum   Essential hypertension - Primary    Blood pressure is at goal for age and co-morbidities.   Recommendations: continue metoprolol 50 mg daily - BP goal <130/80 - monitor and log blood pressures at home - check around the same time each day in a relaxed setting - Limit salt to <2000 mg/day - Follow DASH eating plan (heart healthy diet) - limit alcohol to 2 standard drinks per day for men and 1 per day for women - avoid tobacco products - get at least 2 hours of regular aerobic exercise weekly Patient aware of signs/symptoms requiring further/urgent evaluation. Labs updated today.       Relevant Orders  CBC   Comp Met (CMET)    Return in about 6 months (around 12/05/2022) for routine follow-up.    Terrilyn Saver, NP

## 2022-06-06 NOTE — Addendum Note (Signed)
Addended by: Caleen Jobs B on: 06/06/2022 01:44 PM   Modules accepted: Orders

## 2022-06-09 NOTE — Telephone Encounter (Signed)
Pt has reviewed labs via [place].

## 2022-06-13 ENCOUNTER — Other Ambulatory Visit: Payer: Self-pay | Admitting: Family

## 2022-06-13 DIAGNOSIS — D751 Secondary polycythemia: Secondary | ICD-10-CM

## 2022-06-16 ENCOUNTER — Inpatient Hospital Stay (HOSPITAL_BASED_OUTPATIENT_CLINIC_OR_DEPARTMENT_OTHER): Payer: Self-pay | Admitting: Family

## 2022-06-16 ENCOUNTER — Other Ambulatory Visit: Payer: Self-pay

## 2022-06-16 ENCOUNTER — Encounter: Payer: Self-pay | Admitting: Family

## 2022-06-16 ENCOUNTER — Inpatient Hospital Stay: Payer: Self-pay

## 2022-06-16 ENCOUNTER — Inpatient Hospital Stay: Payer: Self-pay | Attending: Hematology & Oncology

## 2022-06-16 VITALS — BP 141/90 | HR 70 | Temp 98.3°F | Resp 18 | Ht 70.0 in | Wt 157.1 lb

## 2022-06-16 DIAGNOSIS — F17210 Nicotine dependence, cigarettes, uncomplicated: Secondary | ICD-10-CM | POA: Insufficient documentation

## 2022-06-16 DIAGNOSIS — D751 Secondary polycythemia: Secondary | ICD-10-CM | POA: Insufficient documentation

## 2022-06-16 LAB — CBC WITH DIFFERENTIAL (CANCER CENTER ONLY)
Abs Immature Granulocytes: 0.06 10*3/uL (ref 0.00–0.07)
Basophils Absolute: 0.1 10*3/uL (ref 0.0–0.1)
Basophils Relative: 1 %
Eosinophils Absolute: 0.2 10*3/uL (ref 0.0–0.5)
Eosinophils Relative: 4 %
HCT: 56.9 % — ABNORMAL HIGH (ref 39.0–52.0)
Hemoglobin: 19.7 g/dL — ABNORMAL HIGH (ref 13.0–17.0)
Immature Granulocytes: 1 %
Lymphocytes Relative: 31 %
Lymphs Abs: 1.7 10*3/uL (ref 0.7–4.0)
MCH: 34.7 pg — ABNORMAL HIGH (ref 26.0–34.0)
MCHC: 34.6 g/dL (ref 30.0–36.0)
MCV: 100.2 fL — ABNORMAL HIGH (ref 80.0–100.0)
Monocytes Absolute: 0.5 10*3/uL (ref 0.1–1.0)
Monocytes Relative: 9 %
Neutro Abs: 2.9 10*3/uL (ref 1.7–7.7)
Neutrophils Relative %: 54 %
Platelet Count: 160 10*3/uL (ref 150–400)
RBC: 5.68 MIL/uL (ref 4.22–5.81)
RDW: 13.1 % (ref 11.5–15.5)
WBC Count: 5.4 10*3/uL (ref 4.0–10.5)
nRBC: 0 % (ref 0.0–0.2)

## 2022-06-16 LAB — RETICULOCYTES
Immature Retic Fract: 8.8 % (ref 2.3–15.9)
RBC.: 5.62 MIL/uL (ref 4.22–5.81)
Retic Count, Absolute: 102.3 10*3/uL (ref 19.0–186.0)
Retic Ct Pct: 1.8 % (ref 0.4–3.1)

## 2022-06-16 LAB — CMP (CANCER CENTER ONLY)
ALT: 35 U/L (ref 0–44)
AST: 38 U/L (ref 15–41)
Albumin: 4.3 g/dL (ref 3.5–5.0)
Alkaline Phosphatase: 106 U/L (ref 38–126)
Anion gap: 8 (ref 5–15)
BUN: 5 mg/dL — ABNORMAL LOW (ref 8–23)
CO2: 27 mmol/L (ref 22–32)
Calcium: 10.1 mg/dL (ref 8.9–10.3)
Chloride: 101 mmol/L (ref 98–111)
Creatinine: 0.73 mg/dL (ref 0.61–1.24)
GFR, Estimated: >60 mL/min (ref >60)
Glucose, Bld: 99 mg/dL (ref 70–99)
Potassium: 4.7 mmol/L (ref 3.5–5.1)
Sodium: 136 mmol/L (ref 135–145)
Total Bilirubin: 0.7 mg/dL (ref 0.3–1.2)
Total Protein: 7.1 g/dL (ref 6.5–8.1)

## 2022-06-16 LAB — IRON AND IRON BINDING CAPACITY (CC-WL,HP ONLY)
Iron: 130 ug/dL (ref 45–182)
Saturation Ratios: 36 % (ref 17.9–39.5)
TIBC: 364 ug/dL (ref 250–450)
UIBC: 234 ug/dL (ref 117–376)

## 2022-06-16 LAB — FERRITIN: Ferritin: 304 ng/mL (ref 24–336)

## 2022-06-16 NOTE — Progress Notes (Signed)
Jeff Aguirre presents today for phlebotomy per MD orders. Phlebotomy procedure started at 1148 and ended at 1215 .  503 grams removed.  Patient denies need for fluids.  Patient given water and snacks.   Patient observed for 30 minutes after procedure without any incident. Patient tolerated procedure well. IV needle removed intact.

## 2022-06-16 NOTE — Patient Instructions (Signed)

## 2022-06-16 NOTE — Progress Notes (Signed)
Hematology/Oncology Consultation   Name: Jeff Aguirre      MRN: [id number]    Location: Room/bed info not found  Date: 06/16/2022 Time:10:35 AM   REFERRING PHYSICIAN: Caleen Jobs, NP  REASON FOR CONSULT: Elevated Hgb   DIAGNOSIS:  Erythrocytosis Hemochromatosis heterozygous for the H63D mutation  HISTORY OF PRESENT ILLNESS: Jeff Aguirre is a very pleasant 62 yo caucasian gentleman with history of hemochromatosis, heterozygous for the H63D mutation and a year long history of erythrocytosis.  He has never been treated for the hemochromatosis.  He states that he has only donated once with [facility] and had some dizziness afterwards.  He is cutting back on his smoking and is down to 3-4 cigarettes a day. His intention is to completely quit.  No personal history of heart attack, stroke or thrombotic event.  His father had a heart attack and brother had a brain aneurysm.  He is not currently taking any aspirin.  He is not on any testosterone supplementation. He is not taking any iron supplementation.  His most recent colonoscopy was in January 2018. He had 5 benign poyps removed throughout the colon. He was also noted to have diverticulosis in the sigmoid colon and non-bleeding internal hemorrhoids.   No personal history of cancer.  Mother had breast cancer.  No history of diabetes or thyroid disease.  No fever, chills, n/v, cough, rash, dizziness, headaches, SOB, chest pain, palpitations, abdominal pain or changes in bowel or bladder habits.  No swelling, tenderness, numbness or tingling in his extremities.  No falls or syncope.  No recreational drug use. He does occasionally drink 1-3 cold beers in the evening.  No known liver or spleen issues.  Appetite is decreased. He states that his normal is 1 meal a day. He is doing his best to stay well hydrated. His weight is stable at 157 lbs.  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   ROS: All other 10 point review of systems is negative.   PAST MEDICAL HISTORY:   Past Medical History:  Diagnosis Date   Anxiety    Colon polyps    Hypertension     ALLERGIES: Allergies  Allergen Reactions   Morphine Itching    REACTION: itching      MEDICATIONS:  Current Outpatient Medications on File Prior to Visit  Medication Sig Dispense Refill   metoprolol succinate (TOPROL-XL) 50 MG 24 hr tablet Take 1 tablet by mouth once daily 90 tablet 0   No current facility-administered medications on file prior to visit.     PAST SURGICAL HISTORY Past Surgical History:  Procedure Laterality Date   growth on testicle removed  2000's   benign   LUNG SURGERY      FAMILY HISTORY: Family History  Problem Relation Age of Onset   Diabetes Mother    Hypertension Mother    Cancer Mother        breast   Diabetes Father    Hypertension Father    Heart disease Father    Diabetes Sister    Hypertension Sister    Diabetes Brother    Hypertension Brother     SOCIAL HISTORY:  reports that he has been smoking cigarettes. He has a 17.50 pack-year smoking history. He quit smokeless tobacco use about 46 years ago.  His smokeless tobacco use included chew. He reports current alcohol use of about 12.0 - 18.0 standard drinks of alcohol  per week. He reports that he does not use drugs.  PERFORMANCE STATUS: The patient's performance status is 0 - Asymptomatic  PHYSICAL EXAM: Most Recent Vital Signs: There were no vitals taken for this visit. There were no vitals taken for this visit.  General Appearance:    Alert, cooperative, no distress, appears stated age  Head:    Normocephalic, without obvious abnormality, atraumatic  Eyes:    PERRL, conjunctiva/corneas clear, EOM's intact, fundi    benign, both eyes             Throat:   Lips, mucosa, and tongue normal; teeth and gums normal  Neck:   Supple, symmetrical, trachea midline, no adenopathy;       thyroid:  No  enlargement/tenderness/nodules; no carotid   bruit or JVD  Back:     Symmetric, no curvature, ROM normal, no CVA tenderness  Lungs:     Clear to auscultation bilaterally, respirations unlabored  Chest wall:    No tenderness or deformity  Heart:    Regular rate and rhythm, S1 and S2 normal, no murmur, rub   or gallop  Abdomen:     Soft, non-tender, bowel sounds active all four quadrants,    no masses, no organomegaly        Extremities:   Extremities normal, atraumatic, no cyanosis or edema  Pulses:   2+ and symmetric all extremities  Skin:   Skin color, texture, turgor normal, no rashes or lesions  Lymph nodes:   Cervical, supraclavicular, and axillary nodes normal  Neurologic:   CNII-XII intact. Normal strength, sensation and reflexes      throughout    LABORATORY DATA:  No results found for this or any previous visit (from the past 48 hour(s)).    RADIOGRAPHY: No results found.     PATHOLOGY: None  ASSESSMENT/PLAN: Jeff Aguirre is a very pleasant 62 yo caucasian gentleman with history of hemochromatosis, heterozygous for the H63D mutation and a year long history of erythrocytosis.  We will proceed with phlebotomy today for Hgb 19.7.  Iron studies are pending. We will get him on a program for phlebotomy and fluids.  He was given snacks and drinks prior to phlebotomy and fluids will be given if needed.  JAK 2 also pending.  We will have him start taking 1 EC baby aspirin daily.  Follow-up in 6 weeks.   All questions were answered. The patient knows to call the clinic with any problems, questions or concerns. We can certainly see the patient much sooner if necessary.  The patient was discussed with Dr. Marin Olp and he is in agreement with the aforementioned.   Lottie Dawson, NP

## 2022-06-17 ENCOUNTER — Encounter: Payer: Self-pay | Admitting: Family

## 2022-06-23 ENCOUNTER — Inpatient Hospital Stay: Payer: Self-pay

## 2022-06-23 DIAGNOSIS — D751 Secondary polycythemia: Secondary | ICD-10-CM

## 2022-06-23 LAB — CBC WITH DIFFERENTIAL (CANCER CENTER ONLY)
Abs Immature Granulocytes: 0.02 10*3/uL (ref 0.00–0.07)
Basophils Absolute: 0.1 10*3/uL (ref 0.0–0.1)
Basophils Relative: 1 %
Eosinophils Absolute: 0.2 10*3/uL (ref 0.0–0.5)
Eosinophils Relative: 4 %
HCT: 54 % — ABNORMAL HIGH (ref 39.0–52.0)
Hemoglobin: 18.9 g/dL — ABNORMAL HIGH (ref 13.0–17.0)
Immature Granulocytes: 0 %
Lymphocytes Relative: 36 %
Lymphs Abs: 1.9 10*3/uL (ref 0.7–4.0)
MCH: 34.5 pg — ABNORMAL HIGH (ref 26.0–34.0)
MCHC: 35 g/dL (ref 30.0–36.0)
MCV: 98.5 fL (ref 80.0–100.0)
Monocytes Absolute: 0.4 10*3/uL (ref 0.1–1.0)
Monocytes Relative: 8 %
Neutro Abs: 2.6 10*3/uL (ref 1.7–7.7)
Neutrophils Relative %: 51 %
Platelet Count: 170 10*3/uL (ref 150–400)
RBC: 5.48 MIL/uL (ref 4.22–5.81)
RDW: 12.6 % (ref 11.5–15.5)
WBC Count: 5.2 10*3/uL (ref 4.0–10.5)
nRBC: 0 % (ref 0.0–0.2)

## 2022-06-23 LAB — IRON AND IRON BINDING CAPACITY (CC-WL,HP ONLY)
Iron: 166 ug/dL (ref 45–182)
Saturation Ratios: 44 % — ABNORMAL HIGH (ref 17.9–39.5)
TIBC: 377 ug/dL (ref 250–450)
UIBC: 211 ug/dL (ref 117–376)

## 2022-06-23 LAB — CMP (CANCER CENTER ONLY)
ALT: 38 U/L (ref 0–44)
AST: 44 U/L — ABNORMAL HIGH (ref 15–41)
Albumin: 4.3 g/dL (ref 3.5–5.0)
Alkaline Phosphatase: 96 U/L (ref 38–126)
Anion gap: 9 (ref 5–15)
BUN: <5 mg/dL — ABNORMAL LOW (ref 8–23)
CO2: 28 mmol/L (ref 22–32)
Calcium: 9.9 mg/dL (ref 8.9–10.3)
Chloride: 100 mmol/L (ref 98–111)
Creatinine: 0.7 mg/dL (ref 0.61–1.24)
GFR, Estimated: >60 mL/min (ref >60)
Glucose, Bld: 105 mg/dL — ABNORMAL HIGH (ref 70–99)
Potassium: 4.5 mmol/L (ref 3.5–5.1)
Sodium: 137 mmol/L (ref 135–145)
Total Bilirubin: 0.6 mg/dL (ref 0.3–1.2)
Total Protein: 6.9 g/dL (ref 6.5–8.1)

## 2022-06-23 LAB — JAK2 (INCLUDING V617F AND EXON 12), MPL,& CALR-NEXT GEN SEQ

## 2022-06-23 LAB — FERRITIN: Ferritin: 268 ng/mL (ref 24–336)

## 2022-06-23 NOTE — Patient Instructions (Signed)

## 2022-06-23 NOTE — Progress Notes (Signed)
Jeff Aguirre presents today for phlebotomy per MD orders. Phlebotomy procedure started at 10:53 AM and ended at 11:34 AM 519 grams removed via 20 gauge needle to right AC.  Patient observed for 30 minutes after procedure without any incident. Patient tolerated procedure well and declined replacement fluids after procedure.  Patient understands to call if he has any questions or concerns post discharge.

## 2022-06-25 ENCOUNTER — Telehealth: Payer: Self-pay | Admitting: Family

## 2022-06-25 NOTE — Telephone Encounter (Signed)
I was able to speak with Jeff Aguirre and go over recent JAK 2 results and 2 varients of potential significance that were noted. We will continue to follow-up with lab work. He has started taking a baby aspirin daily as directed. He is currently on a phlebotomy program. No changes at this time. Follow-up in 2 weeks.

## 2022-06-27 ENCOUNTER — Other Ambulatory Visit: Payer: Self-pay | Admitting: Family

## 2022-06-27 DIAGNOSIS — D751 Secondary polycythemia: Secondary | ICD-10-CM

## 2022-06-30 ENCOUNTER — Other Ambulatory Visit: Payer: Self-pay | Admitting: Family

## 2022-06-30 ENCOUNTER — Inpatient Hospital Stay: Payer: Self-pay | Attending: Hematology & Oncology

## 2022-06-30 ENCOUNTER — Inpatient Hospital Stay: Payer: Self-pay

## 2022-06-30 ENCOUNTER — Telehealth: Payer: Self-pay | Admitting: *Deleted

## 2022-06-30 DIAGNOSIS — Z1589 Genetic susceptibility to other disease: Secondary | ICD-10-CM

## 2022-06-30 DIAGNOSIS — D751 Secondary polycythemia: Secondary | ICD-10-CM

## 2022-06-30 LAB — CMP (CANCER CENTER ONLY)
ALT: 38 U/L (ref 0–44)
AST: 44 U/L — ABNORMAL HIGH (ref 15–41)
Albumin: 4.3 g/dL (ref 3.5–5.0)
Alkaline Phosphatase: 93 U/L (ref 38–126)
Anion gap: 9 (ref 5–15)
BUN: <5 mg/dL — ABNORMAL LOW (ref 8–23)
CO2: 27 mmol/L (ref 22–32)
Calcium: 9.7 mg/dL (ref 8.9–10.3)
Chloride: 102 mmol/L (ref 98–111)
Creatinine: 0.79 mg/dL (ref 0.61–1.24)
GFR, Estimated: >60 mL/min (ref >60)
Glucose, Bld: 101 mg/dL — ABNORMAL HIGH (ref 70–99)
Potassium: 5 mmol/L (ref 3.5–5.1)
Sodium: 138 mmol/L (ref 135–145)
Total Bilirubin: 0.7 mg/dL (ref 0.3–1.2)
Total Protein: 7.3 g/dL (ref 6.5–8.1)

## 2022-06-30 LAB — CBC WITH DIFFERENTIAL (CANCER CENTER ONLY)
Abs Immature Granulocytes: 0.02 10*3/uL (ref 0.00–0.07)
Basophils Absolute: 0.1 10*3/uL (ref 0.0–0.1)
Basophils Relative: 1 %
Eosinophils Absolute: 0.2 10*3/uL (ref 0.0–0.5)
Eosinophils Relative: 3 %
HCT: 54.7 % — ABNORMAL HIGH (ref 39.0–52.0)
Hemoglobin: 18.8 g/dL — ABNORMAL HIGH (ref 13.0–17.0)
Immature Granulocytes: 0 %
Lymphocytes Relative: 28 %
Lymphs Abs: 1.9 10*3/uL (ref 0.7–4.0)
MCH: 34.2 pg — ABNORMAL HIGH (ref 26.0–34.0)
MCHC: 34.4 g/dL (ref 30.0–36.0)
MCV: 99.6 fL (ref 80.0–100.0)
Monocytes Absolute: 0.5 10*3/uL (ref 0.1–1.0)
Monocytes Relative: 7 %
Neutro Abs: 4.3 10*3/uL (ref 1.7–7.7)
Neutrophils Relative %: 61 %
Platelet Count: 198 10*3/uL (ref 150–400)
RBC: 5.49 MIL/uL (ref 4.22–5.81)
RDW: 12.9 % (ref 11.5–15.5)
WBC Count: 6.9 10*3/uL (ref 4.0–10.5)
nRBC: 0 % (ref 0.0–0.2)

## 2022-06-30 NOTE — Progress Notes (Signed)
Jeff Aguirre presents today for phlebotomy per MD orders. Phlebotomy procedure started at 1116 and ended at 1126 via phlebotomy kit to left ac. 550 grams removed. Patient observed for 30 minutes after procedure without any incident. Patient tolerated procedure well. IV needle removed intact.

## 2022-06-30 NOTE — Patient Instructions (Signed)

## 2022-06-30 NOTE — Telephone Encounter (Signed)
Called patient and lvm of upcoming appointments - requested call back to confirm.

## 2022-07-07 ENCOUNTER — Other Ambulatory Visit: Payer: Self-pay

## 2022-07-14 ENCOUNTER — Other Ambulatory Visit: Payer: Self-pay

## 2022-07-21 ENCOUNTER — Other Ambulatory Visit: Payer: Self-pay

## 2022-07-28 ENCOUNTER — Inpatient Hospital Stay: Payer: Self-pay

## 2022-07-28 ENCOUNTER — Inpatient Hospital Stay: Payer: Self-pay | Admitting: Family

## 2022-08-22 ENCOUNTER — Other Ambulatory Visit: Payer: Self-pay | Admitting: Family Medicine

## 2022-08-22 DIAGNOSIS — I10 Essential (primary) hypertension: Secondary | ICD-10-CM

## 2022-12-04 ENCOUNTER — Telehealth: Payer: Self-pay

## 2022-12-04 ENCOUNTER — Encounter: Payer: Self-pay | Admitting: Family Medicine

## 2022-12-04 ENCOUNTER — Ambulatory Visit (INDEPENDENT_AMBULATORY_CARE_PROVIDER_SITE_OTHER): Payer: Self-pay | Admitting: Family Medicine

## 2022-12-04 VITALS — BP 143/79 | HR 72 | Temp 97.6°F | Resp 20 | Ht 70.0 in | Wt 156.0 lb

## 2022-12-04 DIAGNOSIS — I10 Essential (primary) hypertension: Secondary | ICD-10-CM

## 2022-12-04 DIAGNOSIS — R7989 Other specified abnormal findings of blood chemistry: Secondary | ICD-10-CM

## 2022-12-04 LAB — CBC WITH DIFFERENTIAL/PLATELET
Basophils Absolute: 0 10*3/uL (ref 0.0–0.1)
Basophils Relative: 0.9 % (ref 0.0–3.0)
Eosinophils Absolute: 0.2 10*3/uL (ref 0.0–0.7)
Eosinophils Relative: 4.7 % (ref 0.0–5.0)
HCT: 57.3 % — ABNORMAL HIGH (ref 39.0–52.0)
Hemoglobin: 19.2 g/dL (ref 13.0–17.0)
Lymphocytes Relative: 32.2 % (ref 12.0–46.0)
Lymphs Abs: 1.5 10*3/uL (ref 0.7–4.0)
MCHC: 33.6 g/dL (ref 30.0–36.0)
MCV: 102.7 fl — ABNORMAL HIGH (ref 78.0–100.0)
Monocytes Absolute: 0.3 10*3/uL (ref 0.1–1.0)
Monocytes Relative: 7.4 % (ref 3.0–12.0)
Neutro Abs: 2.5 10*3/uL (ref 1.4–7.7)
Neutrophils Relative %: 54.8 % (ref 43.0–77.0)
Platelets: 138 10*3/uL — ABNORMAL LOW (ref 150.0–400.0)
RBC: 5.58 Mil/uL (ref 4.22–5.81)
RDW: 14.8 % (ref 11.5–15.5)
WBC: 4.6 10*3/uL (ref 4.0–10.5)

## 2022-12-04 LAB — COMPREHENSIVE METABOLIC PANEL
ALT: 36 U/L (ref 0–53)
AST: 40 U/L — ABNORMAL HIGH (ref 0–37)
Albumin: 4.1 g/dL (ref 3.5–5.2)
Alkaline Phosphatase: 104 U/L (ref 39–117)
BUN: 5 mg/dL — ABNORMAL LOW (ref 6–23)
CO2: 27 mEq/L (ref 19–32)
Calcium: 9.3 mg/dL (ref 8.4–10.5)
Chloride: 98 mEq/L (ref 96–112)
Creatinine, Ser: 0.76 mg/dL (ref 0.40–1.50)
GFR: 95.1 mL/min (ref >60.00)
Glucose, Bld: 76 mg/dL (ref 70–99)
Potassium: 4.1 mEq/L (ref 3.5–5.1)
Sodium: 135 mEq/L (ref 135–145)
Total Bilirubin: 0.8 mg/dL (ref 0.2–1.2)
Total Protein: 6.5 g/dL (ref 6.0–8.3)

## 2022-12-04 LAB — IBC + FERRITIN
Ferritin: 141.7 ng/mL (ref 22.0–322.0)
Iron: 156 ug/dL (ref 42–165)
Saturation Ratios: 40.8 % (ref 20.0–50.0)
TIBC: 382.2 ug/dL (ref 250.0–450.0)
Transferrin: 273 mg/dL (ref 212.0–360.0)

## 2022-12-04 MED ORDER — LISINOPRIL 5 MG PO TABS: 5.0000 mg | ORAL_TABLET | Freq: Every day | ORAL | 3 refills | Status: DC

## 2022-12-04 NOTE — Telephone Encounter (Signed)
CRITICAL VALUE STICKER  CRITICAL VALUE: Hgb 19.2  RECEIVER (on-site recipient of call): Lillia Lengel  DATE & TIME NOTIFIED: 12/04/22 4:18pm  MESSENGER (representative from lab): Clydie Braun  MD NOTIFIED: Ladona Ridgel  TIME OF NOTIFICATION: 12/04/22 4:18pm  RESPONSE:

## 2022-12-04 NOTE — Progress Notes (Signed)
Established Patient Office Visit  Subjective   Patient ID: Jeff Aguirre, male    DOB: [birth year]  Age: 62 y.o. MRN: [id number]  Chief Complaint  Patient presents with   Hypertension    6 month follow up    HPI  Patient is here for 77-month follow-up. Reports he has been feeling really well and has been working on quitting smoking.    Hypertension: - Medications: metoprolol succinate 50 mg daily  - Compliance: good - Checking BP at home: rarely - Denies any SOB, recurrent headaches, CP, vision changes, LE edema, dizziness, palpitations, or medication side effects. - Diet: general, trying to eat heart healthy - Exercise: not regularly - Smoking: reports he has cut back to 5 cigarettes a day   Hemachromatosis/erythrocytosis: - At last visit in July 2023 he had elevated Hgb and liver enzymes. He was instructed to come for recheck after staying well hydrated, cutting back on smoking, and minimizing tylenol an alcohol prior to visit. He did not return for repeat labs. This was rechecked and still abnormal at routine follow-up in January 2024 and he was referred to hematology. - He went for hematology initial consult on 06/16/22 and was set up for phlebotomy. He was also started on a baby aspirin. His JAK2 resulted with 2 variants of potential significance and other labs were ordered. He had phlebotomy in February, but he stopped going to hematology due to financial strain.  - States he has been feeling well. He is trying to get more active, quit smoking, and eat a low-iron heart healthy diet.       ROS All review of systems negative except what is listed in the HPI    Objective:     BP (!) 143/79   Pulse 72   Temp 97.6 F (36.4 C) (Oral)   Resp 20   Ht 5\' 10"  (1.778 m)   Wt 156 lb (70.8 kg)   SpO2 98%   BMI 22.38 kg/m    Physical Exam Vitals reviewed.  Constitutional:      Appearance: Normal appearance.  Cardiovascular:     Rate and Rhythm: Normal rate and  regular rhythm.  Pulmonary:     Effort: Pulmonary effort is normal.     Breath sounds: Normal breath sounds.  Musculoskeletal:     Right lower leg: No edema.     Left lower leg: No edema.  Skin:    General: Skin is warm and dry.  Neurological:     General: No focal deficit present.     Mental Status: He is alert and oriented to person, place, and time. Mental status is at baseline.  Psychiatric:        Mood and Affect: Mood normal.        Behavior: Behavior normal.        Thought Content: Thought content normal.        Judgment: Judgment normal.        No results found for any visits on 12/04/22.    The ASCVD Risk score (Arnett DK, et al., 2019) failed to calculate for the following reasons:   The valid total cholesterol range is 130 to 320 mg/dL    Assessment & Plan:   Problem List Items Addressed This Visit     Essential hypertension - Primary (Chronic)    Blood pressure is not at goal for age and co-morbidities.   Recommendations: continue metoprolol. Add lisinopril 5 mg daily. Send list of BP readings in 2-3  weeks. Repeat kidney function lab around that time as well.  - BP goal <130/80 - monitor and log blood pressures at home - check around the same time each day in a relaxed setting - Limit salt to <2000 mg/day - Follow DASH eating plan (heart healthy diet) - limit alcohol to 2 standard drinks per day for men and 1 per day for women - avoid tobacco products - get at least 2 hours of regular aerobic exercise weekly Patient aware of signs/symptoms requiring further/urgent evaluation. Labs updated today.      Relevant Medications   aspirin EC (ASPIR-LOW) 81 MG tablet   lisinopril (ZESTRIL) 5 MG tablet   Other Relevant Orders   Comp Met (CMET)   Basic Metabolic Panel (BMET)   Hereditary hemochromatosis (Chronic)    Updating labs today.  He stopped seeing hematology due to financial concerns - will forward note to them.  Advised he continue daily aspirin 81  mg. Information on [facility] patient assistance program provided.       Relevant Orders   CBC with Differential/Platelet   IBC + Ferritin     Return in about 2 weeks (around 12/18/2022) for lab appointment; routine follow-up in 6 months .    Clayborne Dana, NP

## 2022-12-04 NOTE — Telephone Encounter (Signed)
Noted.  This is his baseline. Established with hematology for known hereditary hemachromatosis with no acute symptoms. Patient is well aware of symptoms requiring further evaluation.

## 2022-12-04 NOTE — Assessment & Plan Note (Signed)
Updating labs today.  He stopped seeing hematology due to financial concerns - will forward note to them.  Advised he continue daily aspirin 81 mg. Information on [facility] patient assistance program provided.

## 2022-12-04 NOTE — Assessment & Plan Note (Signed)
Blood pressure is not at goal for age and co-morbidities.   Recommendations: continue metoprolol. Add lisinopril 5 mg daily. Send list of BP readings in 2-3 weeks. Repeat kidney function lab around that time as well.  - BP goal <130/80 - monitor and log blood pressures at home - check around the same time each day in a relaxed setting - Limit salt to <2000 mg/day - Follow DASH eating plan (heart healthy diet) - limit alcohol to 2 standard drinks per day for men and 1 per day for women - avoid tobacco products - get at least 2 hours of regular aerobic exercise weekly Patient aware of signs/symptoms requiring further/urgent evaluation. Labs updated today.

## 2022-12-04 NOTE — Patient Instructions (Addendum)
Blood pressure is not at goal for age and co-morbidities.   Recommendations: continue metoprolol. Add lisinopril 5 mg daily. Send list of BP readings in 2-3 weeks. Repeat kidney function lab around that time as well.  - BP goal <130/80 - monitor and log blood pressures at home - check around the same time each day in a relaxed setting - Limit salt to <2000 mg/day - Follow DASH eating plan (heart healthy diet) - limit alcohol to 2 standard drinks per day for men and 1 per day for women - avoid tobacco products - get at least 2 hours of regular aerobic exercise weekly Patient aware of signs/symptoms requiring further/urgent evaluation. Labs updated today.    Check into [facility] Patient Assistance Program: form provided today

## 2022-12-04 NOTE — Addendum Note (Signed)
Addended by: Hyman Hopes B on: 12/04/2022 05:24 PM   Modules accepted: Orders

## 2022-12-04 NOTE — Telephone Encounter (Signed)
Spoke with patient, he is aware. Also recommended after discussing with Jeff Aguirre that patient can donate blood through the [facility] (cut off is 20). Patient expressed understanding.

## 2022-12-05 ENCOUNTER — Ambulatory Visit: Payer: Self-pay | Admitting: Family Medicine

## 2022-12-08 ENCOUNTER — Encounter: Payer: Self-pay | Admitting: Family Medicine

## 2022-12-08 ENCOUNTER — Telehealth: Payer: Self-pay | Admitting: Family Medicine

## 2022-12-08 NOTE — Telephone Encounter (Signed)
Tried to call patient. No answer. Will send him a MyChart message.

## 2022-12-10 NOTE — Telephone Encounter (Signed)
Spoke with patient about message. He expressed understanding.   He is still working on paperwork for patient assistance. He states he could afford to have phlebotomy once a month, but could not afford the weekly phlebotomy he was getting. He asked if this would be beneficial at all?

## 2022-12-10 NOTE — Telephone Encounter (Signed)
I think that would be better than nothing. He can go ahead and call or message Sarah NP with hematology and get that set up.

## 2022-12-11 NOTE — Telephone Encounter (Signed)
Patient made aware.

## 2022-12-18 ENCOUNTER — Other Ambulatory Visit (INDEPENDENT_AMBULATORY_CARE_PROVIDER_SITE_OTHER): Payer: Self-pay

## 2022-12-18 ENCOUNTER — Telehealth: Payer: Self-pay

## 2022-12-18 DIAGNOSIS — R7989 Other specified abnormal findings of blood chemistry: Secondary | ICD-10-CM

## 2022-12-18 DIAGNOSIS — I10 Essential (primary) hypertension: Secondary | ICD-10-CM

## 2022-12-18 LAB — BASIC METABOLIC PANEL
BUN: 6 mg/dL (ref 6–23)
CO2: 29 mEq/L (ref 19–32)
Calcium: 9.8 mg/dL (ref 8.4–10.5)
Chloride: 98 mEq/L (ref 96–112)
Creatinine, Ser: 0.79 mg/dL (ref 0.40–1.50)
GFR: 93.97 mL/min (ref >60.00)
Glucose, Bld: 97 mg/dL (ref 70–99)
Potassium: 3.9 mEq/L (ref 3.5–5.1)
Sodium: 136 mEq/L (ref 135–145)

## 2022-12-18 LAB — CBC WITH DIFFERENTIAL/PLATELET
Basophils Absolute: 0.1 10*3/uL (ref 0.0–0.1)
Basophils Relative: 0.8 % (ref 0.0–3.0)
Eosinophils Absolute: 0.2 10*3/uL (ref 0.0–0.7)
Eosinophils Relative: 3.6 % (ref 0.0–5.0)
HCT: 57.6 % — ABNORMAL HIGH (ref 39.0–52.0)
Hemoglobin: 19.4 g/dL (ref 13.0–17.0)
Lymphocytes Relative: 30.7 % (ref 12.0–46.0)
Lymphs Abs: 1.9 10*3/uL (ref 0.7–4.0)
MCHC: 33.7 g/dL (ref 30.0–36.0)
MCV: 102.2 fl — ABNORMAL HIGH (ref 78.0–100.0)
Monocytes Absolute: 0.5 10*3/uL (ref 0.1–1.0)
Monocytes Relative: 8.7 % (ref 3.0–12.0)
Neutro Abs: 3.5 10*3/uL (ref 1.4–7.7)
Neutrophils Relative %: 56.2 % (ref 43.0–77.0)
Platelets: 147 10*3/uL — ABNORMAL LOW (ref 150.0–400.0)
RBC: 5.63 Mil/uL (ref 4.22–5.81)
RDW: 14.5 % (ref 11.5–15.5)
WBC: 6.2 10*3/uL (ref 4.0–10.5)

## 2022-12-18 NOTE — Telephone Encounter (Signed)
Result note in. Baseline for him. As long as he still isn't having symptoms, he needs to try to follow-up with hematology for phlebotomy.

## 2022-12-18 NOTE — Telephone Encounter (Signed)
Last result was 19.2. He was planning on calling Sarah with Hematology, but I don't see appointment scheduled yet.

## 2022-12-18 NOTE — Telephone Encounter (Signed)
CRITICAL VALUE STICKER  CRITICAL VALUE: Hgb 19.4  RECEIVER (on-site recipient of call): Madailein Londo  DATE & TIME NOTIFIED:  12/18/22 12:26pm  MESSENGER (representative from lab): [email or web address].Rack  MD NOTIFIED: Ladona Ridgel   TIME OF NOTIFICATION: 12:27pm  RESPONSE:

## 2023-02-25 ENCOUNTER — Other Ambulatory Visit: Payer: Self-pay | Admitting: Family Medicine

## 2023-02-25 DIAGNOSIS — I10 Essential (primary) hypertension: Secondary | ICD-10-CM

## 2023-05-30 ENCOUNTER — Other Ambulatory Visit: Payer: Self-pay | Admitting: Family Medicine

## 2023-05-30 DIAGNOSIS — I10 Essential (primary) hypertension: Secondary | ICD-10-CM

## 2023-06-04 ENCOUNTER — Ambulatory Visit: Payer: Self-pay | Admitting: Family Medicine

## 2023-06-24 ENCOUNTER — Ambulatory Visit: Payer: Self-pay | Admitting: Family Medicine

## 2023-07-03 ENCOUNTER — Ambulatory Visit: Payer: Self-pay | Admitting: Family Medicine

## 2023-07-25 ENCOUNTER — Encounter: Payer: Self-pay | Admitting: Family

## 2023-07-29 ENCOUNTER — Encounter: Payer: Self-pay | Admitting: Family Medicine

## 2023-07-29 ENCOUNTER — Ambulatory Visit (INDEPENDENT_AMBULATORY_CARE_PROVIDER_SITE_OTHER): Payer: Self-pay | Admitting: Family Medicine

## 2023-07-29 ENCOUNTER — Telehealth: Payer: Self-pay

## 2023-07-29 VITALS — BP 127/66 | HR 74 | Temp 98.0°F | Ht 70.0 in | Wt 165.0 lb

## 2023-07-29 DIAGNOSIS — I10 Essential (primary) hypertension: Secondary | ICD-10-CM

## 2023-07-29 DIAGNOSIS — Z1211 Encounter for screening for malignant neoplasm of colon: Secondary | ICD-10-CM

## 2023-07-29 DIAGNOSIS — R7989 Other specified abnormal findings of blood chemistry: Secondary | ICD-10-CM

## 2023-07-29 DIAGNOSIS — Z0000 Encounter for general adult medical examination without abnormal findings: Secondary | ICD-10-CM

## 2023-07-29 DIAGNOSIS — Z114 Encounter for screening for human immunodeficiency virus [HIV]: Secondary | ICD-10-CM

## 2023-07-29 LAB — CBC WITH DIFFERENTIAL/PLATELET
Basophils Absolute: 0.1 10*3/uL (ref 0.0–0.1)
Basophils Relative: 1.1 % (ref 0.0–3.0)
Eosinophils Absolute: 0.2 10*3/uL (ref 0.0–0.7)
Eosinophils Relative: 3.8 % (ref 0.0–5.0)
HCT: 54 % — ABNORMAL HIGH (ref 39.0–52.0)
Hemoglobin: 18.5 g/dL (ref 13.0–17.0)
Lymphocytes Relative: 33.6 % (ref 12.0–46.0)
Lymphs Abs: 1.7 10*3/uL (ref 0.7–4.0)
MCHC: 34.3 g/dL (ref 30.0–36.0)
MCV: 101.5 fl — ABNORMAL HIGH (ref 78.0–100.0)
Monocytes Absolute: 0.4 10*3/uL (ref 0.1–1.0)
Monocytes Relative: 8.5 % (ref 3.0–12.0)
Neutro Abs: 2.7 10*3/uL (ref 1.4–7.7)
Neutrophils Relative %: 53 % (ref 43.0–77.0)
Platelets: 181 10*3/uL (ref 150.0–400.0)
RBC: 5.32 Mil/uL (ref 4.22–5.81)
RDW: 13.8 % (ref 11.5–15.5)
WBC: 5 10*3/uL (ref 4.0–10.5)

## 2023-07-29 LAB — COMPREHENSIVE METABOLIC PANEL
ALT: 56 U/L — ABNORMAL HIGH (ref 0–53)
AST: 52 U/L — ABNORMAL HIGH (ref 0–37)
Albumin: 4.6 g/dL (ref 3.5–5.2)
Alkaline Phosphatase: 95 U/L (ref 39–117)
BUN: 5 mg/dL — ABNORMAL LOW (ref 6–23)
CO2: 30 meq/L (ref 19–32)
Calcium: 9.5 mg/dL (ref 8.4–10.5)
Chloride: 97 meq/L (ref 96–112)
Creatinine, Ser: 0.71 mg/dL (ref 0.40–1.50)
GFR: 96.63 mL/min (ref >60.00)
Glucose, Bld: 90 mg/dL (ref 70–99)
Potassium: 4.4 meq/L (ref 3.5–5.1)
Sodium: 134 meq/L — ABNORMAL LOW (ref 135–145)
Total Bilirubin: 0.9 mg/dL (ref 0.2–1.2)
Total Protein: 7.3 g/dL (ref 6.0–8.3)

## 2023-07-29 LAB — LIPID PANEL
Cholesterol: 139 mg/dL (ref 0–200)
HDL: 56.4 mg/dL (ref >39.00)
LDL Cholesterol: 50 mg/dL (ref 0–99)
NonHDL: 82.54
Total CHOL/HDL Ratio: 2
Triglycerides: 162 mg/dL — ABNORMAL HIGH (ref 0.0–149.0)
VLDL: 32.4 mg/dL (ref 0.0–40.0)

## 2023-07-29 NOTE — Assessment & Plan Note (Signed)
 Blood pressure is not at goal for age and co-morbidities.   Recommendations: continue metoprolol 50 mg daily and lisinopril 5 mg daily - BP goal <130/80 - monitor and log blood pressures at home - check around the same time each day in a relaxed setting - Limit salt to <2000 mg/day - Follow DASH eating plan (heart healthy diet) - limit alcohol to 2 standard drinks per day for men and 1 per day for women - avoid tobacco products - get at least 2 hours of regular aerobic exercise weekly Patient aware of signs/symptoms requiring further/urgent evaluation. Labs updated today.

## 2023-07-29 NOTE — Telephone Encounter (Signed)
 CRITICAL VALUE STICKER  CRITICAL VALUE: Hgb 18.5  RECEIVER (on-site recipient of call): Yesica Kemler  DATE & TIME NOTIFIED: 07/29/23 12:38pm  MESSENGER (representative from lab): Clydie Braun  MD NOTIFIED: Hyman Hopes  TIME OF NOTIFICATION: 07/29/23 12:38pm  RESPONSE:

## 2023-07-29 NOTE — Progress Notes (Signed)
 Established Patient Office Visit  Subjective   Patient ID: Jeff Aguirre, male    DOB: 1960/10/19  Age: 63 y.o. MRN: [id number]  Chief Complaint  Patient presents with   Medical Management of Chronic Issues    HPI  Patient is here for 35-month follow-up. Reports he has been feeling really well and has been working on quitting smoking (down to 4 cigarettes a day). He is enjoying retirement.    Hypertension: - Medications: metoprolol succinate 50 mg daily, lisinopril 5 mg daily - Compliance: good - Checking BP at home: rarely - Denies any SOB, recurrent headaches, CP, vision changes, LE edema, dizziness, palpitations, or medication side effects. - Diet: general, trying to eat heart healthy - Exercise: not regularly, planning to start golf again and maybe join [employer] - Smoking: reports he has cut back to 4 cigarettes a day   Hemachromatosis/erythrocytosis: - At visit in July 2023 he had elevated Hgb and liver enzymes. He was instructed to come for recheck after staying well hydrated, cutting back on smoking, and minimizing tylenol an alcohol prior to visit. He did not return for repeat labs. This was rechecked and still abnormal at routine follow-up in January 2024 and he was referred to hematology. - He went for hematology initial consult on 06/16/22 and was set up for phlebotomy. He was also started on a baby aspirin. His JAK2 resulted with 2 variants of potential significance and other labs were ordered. He had phlebotomy in February 2024, but he stopped going to hematology due to financial strain.  - States he has been feeling well. He is trying to get more active, quit smoking, and eat a low-iron heart healthy diet.  - 07/29/2023 - patient states he now has Medicare and agrees to schedule appointment with hematology so they can resume phlebotomy. Routine labs ordered today.     ROS All review of systems negative except what is listed in the HPI    Objective:     BP  127/66   Pulse 74   Temp 98 F (36.7 C) (Oral)   Ht 5\' 10"  (1.778 m)   Wt 165 lb (74.8 kg)   SpO2 98%   BMI 23.68 kg/m    Physical Exam Vitals reviewed.  Constitutional:      Appearance: Normal appearance.  Cardiovascular:     Rate and Rhythm: Normal rate and regular rhythm.  Pulmonary:     Effort: Pulmonary effort is normal.     Breath sounds: Normal breath sounds.  Musculoskeletal:     Right lower leg: No edema.     Left lower leg: No edema.  Skin:    General: Skin is warm and dry.  Neurological:     General: No focal deficit present.     Mental Status: He is alert and oriented to person, place, and time. Mental status is at baseline.  Psychiatric:        Mood and Affect: Mood normal.        Behavior: Behavior normal.        Thought Content: Thought content normal.        Judgment: Judgment normal.        No results found for any visits on 07/29/23.    The ASCVD Risk score (Arnett DK, et al., 2019) failed to calculate for the following reasons:   The valid total cholesterol range is 130 to 320 mg/dL    Assessment & Plan:   Problem List Items Addressed This Visit  Active Problems   Essential hypertension - Primary (Chronic)   Blood pressure is not at goal for age and co-morbidities.   Recommendations: continue metoprolol 50 mg daily and lisinopril 5 mg daily - BP goal <130/80 - monitor and log blood pressures at home - check around the same time each day in a relaxed setting - Limit salt to <2000 mg/day - Follow DASH eating plan (heart healthy diet) - limit alcohol to 2 standard drinks per day for men and 1 per day for women - avoid tobacco products - get at least 2 hours of regular aerobic exercise weekly Patient aware of signs/symptoms requiring further/urgent evaluation. Labs updated today.      Relevant Orders   Comprehensive metabolic panel   Hereditary hemochromatosis (Chronic)   Updating labs today.  He agrees to schedule an  appointment with hematology  Advised he continue daily aspirin 81 mg.       Relevant Orders   CBC with Differential/Platelet   Other Visit Diagnoses       Elevated LFTs       Relevant Orders   Comprehensive metabolic panel     Preventative health care       Relevant Orders   Lipid panel     Encounter for screening for HIV       Relevant Orders   HIV Antibody (routine testing w rflx)     Screen for colon cancer       Relevant Orders   Ambulatory referral to Gastroenterology        Return in about 6 months (around 01/29/2024) for physical / schedule AWV.    Clayborne Dana, NP

## 2023-07-29 NOTE — Patient Instructions (Addendum)
Hematology : [phone]

## 2023-07-29 NOTE — Telephone Encounter (Signed)
 Baseline for him. Asymptomatic at visit and he is aware when to seek further care. He is to be getting an appointment with hematology for phlebotomy.

## 2023-07-29 NOTE — Assessment & Plan Note (Signed)
 Updating labs today.  He agrees to schedule an appointment with hematology  Advised he continue daily aspirin 81 mg.

## 2023-07-30 ENCOUNTER — Encounter: Payer: Self-pay | Admitting: Family Medicine

## 2023-07-30 LAB — HIV ANTIBODY (ROUTINE TESTING W REFLEX): HIV 1&2 Ab, 4th Generation: NONREACTIVE

## 2023-07-30 NOTE — Progress Notes (Signed)
 Hgb still high - keep upcoming appointment with hematology. Follow-up sooner for any  new symptoms.   Liver enzymes are mildly elevated. Recheck fasting in 2-3 months.   Triglycerides are mildly elevated, but not fasting - recommend heart healthy diet, fish oil supplement, exercise.

## 2023-07-30 NOTE — Addendum Note (Signed)
 Addended by: Hyman Hopes B on: 07/30/2023 04:29 PM   Modules accepted: Orders

## 2023-08-04 ENCOUNTER — Other Ambulatory Visit: Payer: Self-pay | Admitting: Family

## 2023-08-04 DIAGNOSIS — D500 Iron deficiency anemia secondary to blood loss (chronic): Secondary | ICD-10-CM

## 2023-08-04 DIAGNOSIS — Z1589 Genetic susceptibility to other disease: Secondary | ICD-10-CM

## 2023-08-05 ENCOUNTER — Inpatient Hospital Stay: Admitting: Family

## 2023-08-05 ENCOUNTER — Encounter: Payer: Self-pay | Admitting: Family

## 2023-08-05 ENCOUNTER — Inpatient Hospital Stay: Attending: Hematology & Oncology

## 2023-08-05 ENCOUNTER — Inpatient Hospital Stay

## 2023-08-05 VITALS — BP 134/87 | HR 95 | Temp 98.5°F | Resp 17 | Wt 167.0 lb

## 2023-08-05 DIAGNOSIS — D751 Secondary polycythemia: Secondary | ICD-10-CM

## 2023-08-05 DIAGNOSIS — D500 Iron deficiency anemia secondary to blood loss (chronic): Secondary | ICD-10-CM

## 2023-08-05 DIAGNOSIS — Z7982 Long term (current) use of aspirin: Secondary | ICD-10-CM | POA: Diagnosis not present

## 2023-08-05 DIAGNOSIS — Z1589 Genetic susceptibility to other disease: Secondary | ICD-10-CM

## 2023-08-05 LAB — CBC WITH DIFFERENTIAL (CANCER CENTER ONLY)
Abs Immature Granulocytes: 0.02 10*3/uL (ref 0.00–0.07)
Basophils Absolute: 0.1 10*3/uL (ref 0.0–0.1)
Basophils Relative: 1 %
Eosinophils Absolute: 0.2 10*3/uL (ref 0.0–0.5)
Eosinophils Relative: 4 %
HCT: 51.2 % (ref 39.0–52.0)
Hemoglobin: 18.1 g/dL — ABNORMAL HIGH (ref 13.0–17.0)
Immature Granulocytes: 0 %
Lymphocytes Relative: 35 %
Lymphs Abs: 1.9 10*3/uL (ref 0.7–4.0)
MCH: 34.3 pg — ABNORMAL HIGH (ref 26.0–34.0)
MCHC: 35.4 g/dL (ref 30.0–36.0)
MCV: 97.2 fL (ref 80.0–100.0)
Monocytes Absolute: 0.4 10*3/uL (ref 0.1–1.0)
Monocytes Relative: 7 %
Neutro Abs: 2.8 10*3/uL (ref 1.7–7.7)
Neutrophils Relative %: 53 %
Platelet Count: 164 10*3/uL (ref 150–400)
RBC: 5.27 MIL/uL (ref 4.22–5.81)
RDW: 13.1 % (ref 11.5–15.5)
WBC Count: 5.4 10*3/uL (ref 4.0–10.5)
nRBC: 0 % (ref 0.0–0.2)

## 2023-08-05 LAB — CMP (CANCER CENTER ONLY)
ALT: 40 U/L (ref 0–44)
AST: 41 U/L (ref 15–41)
Albumin: 4.5 g/dL (ref 3.5–5.0)
Alkaline Phosphatase: 101 U/L (ref 38–126)
Anion gap: 8 (ref 5–15)
BUN: 6 mg/dL — ABNORMAL LOW (ref 8–23)
CO2: 28 mmol/L (ref 22–32)
Calcium: 9.4 mg/dL (ref 8.9–10.3)
Chloride: 100 mmol/L (ref 98–111)
Creatinine: 0.73 mg/dL (ref 0.61–1.24)
GFR, Estimated: >60 mL/min (ref >60)
Glucose, Bld: 92 mg/dL (ref 70–99)
Potassium: 4.5 mmol/L (ref 3.5–5.1)
Sodium: 136 mmol/L (ref 135–145)
Total Bilirubin: 0.5 mg/dL (ref 0.0–1.2)
Total Protein: 7.3 g/dL (ref 6.5–8.1)

## 2023-08-05 LAB — IRON AND IRON BINDING CAPACITY (CC-WL,HP ONLY)
Iron: 133 ug/dL (ref 45–182)
Saturation Ratios: 35 % (ref 17.9–39.5)
TIBC: 378 ug/dL (ref 250–450)
UIBC: 245 ug/dL (ref 117–376)

## 2023-08-05 LAB — FERRITIN: Ferritin: 471 ng/mL — ABNORMAL HIGH (ref 24–336)

## 2023-08-05 NOTE — Progress Notes (Signed)
 Jeff Aguirre presents today for phlebotomy per MD orders. Phlebotomy procedure started at 11:50am and ended at 1200pm. 550 gms removed. Patient tolerated procedure well. IV needle removed intact. Nourishments given.

## 2023-08-05 NOTE — Progress Notes (Signed)
 Hematology and Oncology Follow Up Visit  Jeff Aguirre [id number] 14-Mar-1961 63 y.o. 08/05/2023   Principle Diagnosis:  Hemochromatosis, heterozygous for the H63D mutation JAK2 negative but did have tier II variants of potential significance (ASXL1 p.Lys523Ter and  TP53 p.[id number])  Current Therapy:   Phlebotomy to maintain Hct < 45%, iron saturation < 50% and ferritin < 100 EC aspirin    Interim History:  Jeff Aguirre is here today for follow-up. He is doing well and has no complaints at this time.  He is taking his baby aspirin daily as directed.  No fever, chills, n/v, cough, rash, dizziness, vision loss or changes, headaches SOB, chest pain, palpitations, abdominal, pain or changes in bowel or bladder habits.  No blood loss, bruising or petechiae.  No swelling, tenderness, numbness or tingling in his extremities at this time.  No falls or syncope.  Appetite and hydration are fair. He does have a protein drink daily. Weight is stable at 167 lbs.   ECOG Performance Status: 1 - Symptomatic but completely ambulatory  Medications:  Allergies as of 08/05/2023       Reactions   Morphine Itching        Medication List        Accurate as of August 05, 2023 10:29 AM. If you have any questions, ask your nurse or doctor.          Aspir-Low 81 MG tablet Generic drug: aspirin EC Take 81 mg by mouth daily.   lisinopril 5 MG tablet Commonly known as: ZESTRIL Take 1 tablet (5 mg total) by mouth daily.   metoprolol succinate 50 MG 24 hr tablet Commonly known as: TOPROL-XL Take 1 tablet (50 mg total) by mouth daily.        Allergies:  Allergies  Allergen Reactions   Morphine Itching    Past Medical History, Surgical history, Social history, and Family History were reviewed and updated.  Review of Systems: All other 10 point review of systems is negative.   Physical Exam:  weight is 167 lb (75.8 kg). His oral temperature is 98.5 F (36.9 C). His blood pressure  is 134/87 and his pulse is 95. His respiration is 17 and oxygen saturation is 98%.   Wt Readings from Last 3 Encounters:  08/05/23 167 lb (75.8 kg)  07/29/23 165 lb (74.8 kg)  12/04/22 156 lb (70.8 kg)    Ocular: Sclerae unicteric, pupils equal, round and reactive to light Ear-nose-throat: Oropharynx clear, dentition fair Lymphatic: No cervical or supraclavicular adenopathy Lungs no rales or rhonchi, good excursion bilaterally Heart regular rate and rhythm, no murmur appreciated Abd soft, nontender, positive bowel sounds MSK no focal spinal tenderness, no joint edema Neuro: non-focal, well-oriented, appropriate affect Breasts: Deferred   Lab Results  Component Value Date   WBC 5.4 08/05/2023   HGB 18.1 (H) 08/05/2023   HCT 51.2 08/05/2023   MCV 97.2 08/05/2023   PLT 164 08/05/2023   Lab Results  Component Value Date   FERRITIN 141.7 12/04/2022   IRON 156 12/04/2022   TIBC 382.2 12/04/2022   UIBC 211 06/23/2022   IRONPCTSAT 40.8 12/04/2022   Lab Results  Component Value Date   RETICCTPCT 1.8 06/16/2022   RBC 5.27 08/05/2023   No results found for: "KPAFRELGTCHN", "LAMBDASER", "KAPLAMBRATIO" No results found for: "IGGSERUM", "IGA", "IGMSERUM" No results found for: "TOTALPROTELP", "ALBUMINELP", "A1GS", "A2GS", "BETS", "BETA2SER", "GAMS", "MSPIKE", "SPEI"   Chemistry      Component Value Date/Time   NA 134 (L) 07/29/2023 1037  K 4.4 07/29/2023 1037   CL 97 07/29/2023 1037   CO2 30 07/29/2023 1037   BUN 5 (L) 07/29/2023 1037   CREATININE 0.71 07/29/2023 1037   CREATININE 0.79 06/30/2022 1024   CREATININE 0.64 (L) 10/25/2020 0000      Component Value Date/Time   CALCIUM 9.5 07/29/2023 1037   ALKPHOS 95 07/29/2023 1037   AST 52 (H) 07/29/2023 1037   AST 44 (H) 06/30/2022 1024   ALT 56 (H) 07/29/2023 1037   ALT 38 06/30/2022 1024   BILITOT 0.9 07/29/2023 1037   BILITOT 0.7 06/30/2022 1024       Impression and Plan: Mr. Piercefield is a very pleasant 63 yo  caucasian gentleman with history of hemochromatosis, heterozygous for the H63D mutation and a year long history of erythrocytosis, JAK 2 negative but was noted to have a couple other variant o potential significance.  He is taking his baby aspirin daily.  CBC with diff reviewed with Dr. Myna Hidalgo. No medication changes at this time.  We will proceed with phlebotomy today and again next week.  Follow-up in 6 weeks.    Eileen Stanford, NP 3/12/202510:29 AM

## 2023-08-05 NOTE — Patient Instructions (Signed)
 PhTherapeutic Phlebotomy, Care After The following information offers guidance on how to care for yourself after your procedure. Your health care provider may also give you more specific instructions. If you have problems or questions, contact your health care provider. What can I expect after the procedure? After therapeutic phlebotomy, it is common to have: Light-headedness or dizziness. You may feel faint. Nausea. Tiredness (fatigue). Follow these instructions at home: Eating and drinking Be sure to eat well-balanced meals for the next 24 hours. Drink enough fluid to keep your urine pale yellow. Avoid drinking alcohol on the day that you had the procedure. Activity  Return to your normal activities as told by your health care provider. Most people can go back to their normal activities right away. Avoid activities that take a lot of effort for about 5 hours after the procedure. Athletes should avoid strenuous exercise for at least 12 hours. Avoid heavy lifting or pulling for about 5 hours after the procedure. Do not lift anything that is heavier than 10 lb (4.5 kg). Change positions slowly for the remainder of the day, like from sitting to standing. This can help prevent light-headedness or fainting. If you feel light-headed, lie down until the feeling goes away. Needle insertion site care  Keep your bandage (dressing) dry. You can remove the bandage after about 5 hours or as told by your health care provider. If you have bleeding from the needle insertion site, raise (elevate) your arm and press firmly on the site until the bleeding stops. If you have bruising at the site, apply ice to the area. To do this: Put ice in a plastic bag. Place a towel between your skin and the bag. Leave the ice on for 20 minutes, 2-3 times a day for the first 24 hours. Remove the ice if your skin turns bright red so you do not damage the area. If the swelling does not go away after 24 hours, apply a warm,  moist cloth (warm compress) to the area for 20 minutes, 2-3 times a day. General instructions Do not use any products that contain nicotine or tobacco, like cigarettes, chewing tobacco, and vaping devices, such as e-cigarettes, for at least 30 minutes after the procedure. If you need help quitting, ask your health care provider. Keep all follow-up visits. You may need to continue having regular blood tests and therapeutic phlebotomy treatments as directed. Contact a health care provider if: You have redness, swelling, or pain at the needle insertion site. Fluid or blood is coming from the needle insertion site. Pus or a bad smell is coming from the needle insertion site. The needle insertion site feels warm to the touch. You feel light-headed, dizzy, or nauseous, and the feeling does not go away. You have new bruising at the needle insertion site. You feel weaker than normal. You have a fever or chills. Get help right away if: You have chest pain. You have trouble breathing. You have severe nausea or vomiting. Summary After the procedure, it is common to have some light-headedness, dizziness, nausea, or tiredness (fatigue). Be sure to eat well-balanced meals for the next 24 hours. Drink enough fluid to keep your urine pale yellow. Return to your normal activities as told by your health care provider. Keep all follow-up visits. You may need to continue having regular blood tests and therapeutic phlebotomy treatments as directed. This information is not intended to replace advice given to you by your health care provider. Make sure you discuss any questions you have  with your health care provider. Document Revised: 10/28/2022 Document Reviewed: 11/07/2020 Elsevier Patient Education  2024 [employer].

## 2023-08-12 ENCOUNTER — Inpatient Hospital Stay

## 2023-08-12 NOTE — Patient Instructions (Signed)

## 2023-08-12 NOTE — Progress Notes (Signed)
 Jeff Aguirre presents today for phlebotomy per MD orders. Phlebotomy procedure started at 1138 and ended at 1145. 540 cc removed via 16 G needle at [facility] site. Patient tolerated procedure well. Patient refused to wait 30 minutes post phlebotomy, released stable and ASX.

## 2023-08-27 ENCOUNTER — Other Ambulatory Visit: Payer: Self-pay | Admitting: Family Medicine

## 2023-08-27 DIAGNOSIS — I10 Essential (primary) hypertension: Secondary | ICD-10-CM

## 2023-09-16 ENCOUNTER — Inpatient Hospital Stay: Attending: Family

## 2023-09-16 ENCOUNTER — Inpatient Hospital Stay

## 2023-09-16 ENCOUNTER — Inpatient Hospital Stay: Admitting: Hematology & Oncology

## 2023-09-16 ENCOUNTER — Encounter: Payer: Self-pay | Admitting: Hematology & Oncology

## 2023-09-16 DIAGNOSIS — Z7982 Long term (current) use of aspirin: Secondary | ICD-10-CM | POA: Diagnosis not present

## 2023-09-16 DIAGNOSIS — D751 Secondary polycythemia: Secondary | ICD-10-CM

## 2023-09-16 DIAGNOSIS — D500 Iron deficiency anemia secondary to blood loss (chronic): Secondary | ICD-10-CM

## 2023-09-16 LAB — CBC WITH DIFFERENTIAL (CANCER CENTER ONLY)
Abs Immature Granulocytes: 0.02 10*3/uL (ref 0.00–0.07)
Basophils Absolute: 0.1 10*3/uL (ref 0.0–0.1)
Basophils Relative: 1 %
Eosinophils Absolute: 0.2 10*3/uL (ref 0.0–0.5)
Eosinophils Relative: 4 %
HCT: 53.6 % — ABNORMAL HIGH (ref 39.0–52.0)
Hemoglobin: 18.3 g/dL — ABNORMAL HIGH (ref 13.0–17.0)
Immature Granulocytes: 0 %
Lymphocytes Relative: 41 %
Lymphs Abs: 2.2 10*3/uL (ref 0.7–4.0)
MCH: 34.2 pg — ABNORMAL HIGH (ref 26.0–34.0)
MCHC: 34.1 g/dL (ref 30.0–36.0)
MCV: 100.2 fL — ABNORMAL HIGH (ref 80.0–100.0)
Monocytes Absolute: 0.5 10*3/uL (ref 0.1–1.0)
Monocytes Relative: 10 %
Neutro Abs: 2.4 10*3/uL (ref 1.7–7.7)
Neutrophils Relative %: 44 %
Platelet Count: 183 10*3/uL (ref 150–400)
RBC: 5.35 MIL/uL (ref 4.22–5.81)
RDW: 13.9 % (ref 11.5–15.5)
WBC Count: 5.5 10*3/uL (ref 4.0–10.5)
nRBC: 0 % (ref 0.0–0.2)

## 2023-09-16 LAB — CMP (CANCER CENTER ONLY)
ALT: 44 U/L (ref 0–44)
AST: 54 U/L — ABNORMAL HIGH (ref 15–41)
Albumin: 4.3 g/dL (ref 3.5–5.0)
Alkaline Phosphatase: 107 U/L (ref 38–126)
Anion gap: 8 (ref 5–15)
BUN: <5 mg/dL — ABNORMAL LOW (ref 8–23)
CO2: 27 mmol/L (ref 22–32)
Calcium: 9.3 mg/dL (ref 8.9–10.3)
Chloride: 100 mmol/L (ref 98–111)
Creatinine: 0.77 mg/dL (ref 0.61–1.24)
GFR, Estimated: >60 mL/min (ref >60)
Glucose, Bld: 75 mg/dL (ref 70–99)
Potassium: 4.1 mmol/L (ref 3.5–5.1)
Sodium: 135 mmol/L (ref 135–145)
Total Bilirubin: 0.4 mg/dL (ref 0.0–1.2)
Total Protein: 6.7 g/dL (ref 6.5–8.1)

## 2023-09-16 LAB — IRON AND IRON BINDING CAPACITY (CC-WL,HP ONLY)
Iron: 177 ug/dL (ref 45–182)
Saturation Ratios: 41 % — ABNORMAL HIGH (ref 17.9–39.5)
TIBC: 431 ug/dL (ref 250–450)
UIBC: 254 ug/dL (ref 117–376)

## 2023-09-16 LAB — FERRITIN: Ferritin: 269 ng/mL (ref 24–336)

## 2023-09-16 NOTE — Patient Instructions (Signed)

## 2023-09-16 NOTE — Progress Notes (Signed)
 Hematology and Oncology Follow Up Visit  Jeff Aguirre [id number] 02/04/1961 63 y.o. 09/16/2023   Principle Diagnosis:  Hemochromatosis, heterozygous for the H63D mutation JAK2 negative but did have tier II variants of potential significance (ASXL1 p.Lys523Ter and  TP53 p.[id number])  Current Therapy:   Phlebotomy to maintain Hct < 45%, iron saturation < 50% and ferritin < 100 EC ASA 81 mg p.o. daily   Interim History:  Jeff Aguirre is here today for follow-up.  This is my first visit with him.  He is a really nice guy.  Is very happy that he has done so well.  He is retired.  He does try to stay active.  The last time that he was here, his ferritin went and was 471 with an iron saturation of 35%.  I told him that he could certainly donate to the [facility] Agu[facility].  I think his blood is appropriate for donation.  No one will be harmed by too much iron in his blood.  He has had no issues with nausea or vomiting.  He has had no issues with fever.  There is been no problems with COVID or Influenza.  He has had no change in bowel or bladder habits.  Para he still smokes.  He smokes 4 cigarettes a day.  He will Back on this and stop.  He does take aspirin .  Overall, his performance status is probably ECOG 0.   Medications:  Allergies as of 09/16/2023       Reactions   Morphine Itching        Medication List        Accurate as of September 16, 2023 10:31 AM. If you have any questions, ask your nurse or doctor.          Aspir-Low 81 MG tablet Generic drug: aspirin  EC Take 81 mg by mouth daily.   lisinopril  5 MG tablet Commonly known as: ZESTRIL  Take 1 tablet (5 mg total) by mouth daily.   metoprolol  succinate 50 MG 24 hr tablet Commonly known as: TOPROL -XL Take 1 tablet by mouth once daily        Allergies:  Allergies  Allergen Reactions   Morphine Itching    Past Medical History, Surgical history, Social history, and Family History were reviewed and  updated.  Review of Systems:   Review of Systems  Constitutional: Negative.   HENT: Negative.    Eyes: Negative.   Respiratory: Negative.    Cardiovascular: Negative.   Gastrointestinal: Negative.   Genitourinary: Negative.   Musculoskeletal: Negative.   Skin: Negative.   Neurological: Negative.   Endo/Heme/Allergies: Negative.   Psychiatric/Behavioral: Negative.      Physical Exam:  height is 5\' 10"  (1.778 m) and weight is 165 lb 12.8 oz (75.2 kg). His oral temperature is 98.9 F (37.2 C). His blood pressure is 121/72 and his pulse is 82. His respiration is 20 and oxygen saturation is 98%.   Wt Readings from Last 3 Encounters:  09/16/23 165 lb 12.8 oz (75.2 kg)  08/05/23 167 lb (75.8 kg)  07/29/23 165 lb (74.8 kg)    Physical Exam Vitals reviewed.  HENT:     Head: Normocephalic and atraumatic.  Eyes:     Pupils: Pupils are equal, round, and reactive to light.  Cardiovascular:     Rate and Rhythm: Normal rate and regular rhythm.     Heart sounds: Normal heart sounds.  Pulmonary:     Effort: Pulmonary effort is normal.  Breath sounds: Normal breath sounds.  Abdominal:     General: Bowel sounds are normal.     Palpations: Abdomen is soft.  Musculoskeletal:        General: No tenderness or deformity. Normal range of motion.     Cervical back: Normal range of motion.  Lymphadenopathy:     Cervical: No cervical adenopathy.  Skin:    General: Skin is warm and dry.     Findings: No erythema or rash.  Neurological:     Mental Status: He is alert and oriented to person, place, and time.  Psychiatric:        Behavior: Behavior normal.        Thought Content: Thought content normal.        Judgment: Judgment normal.     Lab Results  Component Value Date   WBC 5.5 09/16/2023   HGB 18.3 (H) 09/16/2023   HCT 53.6 (H) 09/16/2023   MCV 100.2 (H) 09/16/2023   PLT 183 09/16/2023   Lab Results  Component Value Date   FERRITIN 471 (H) 08/05/2023   IRON 133  08/05/2023   TIBC 378 08/05/2023   UIBC 245 08/05/2023   IRONPCTSAT 35 08/05/2023   Lab Results  Component Value Date   RETICCTPCT 1.8 06/16/2022   RBC 5.35 09/16/2023   No results found for: "KPAFRELGTCHN", "LAMBDASER", "KAPLAMBRATIO" No results found for: "IGGSERUM", "IGA", "IGMSERUM" No results found for: "TOTALPROTELP", "ALBUMINELP", "A1GS", "A2GS", "BETS", "BETA2SER", "GAMS", "MSPIKE", "SPEI"   Chemistry      Component Value Date/Time   NA 135 09/16/2023 0926   K 4.1 09/16/2023 0926   CL 100 09/16/2023 0926   CO2 27 09/16/2023 0926   BUN <5 (L) 09/16/2023 0926   CREATININE 0.77 09/16/2023 0926   CREATININE 0.64 (L) 10/25/2020 0000      Component Value Date/Time   CALCIUM  9.3 09/16/2023 0926   ALKPHOS 107 09/16/2023 0926   AST 54 (H) 09/16/2023 0926   ALT 44 09/16/2023 0926   BILITOT 0.4 09/16/2023 0926       Impression and Plan: Jeff Aguirre is a very pleasant 63 yo caucasian gentleman with history of hemochromatosis, heterozygous for the H63D mutation and a year long history of erythrocytosis, JAK 2 negative but was noted to have a couple other variant o potential significance.   Our goal right now is to make sure we phlebotomize him.  Again, he can donate to the [facility] Agu[facility]re.  I really do not see a problem with him donating blood.  His blood is safe.  He certainly has enough of it that he could donate.  For today, we will go ahead and phlebotomize him.  I still think we have to watch him relatively closely.  Maybe, when he stops smoking, he we will have a decrease in his hemoglobin.  I will plan to get him back to see me in another couple months.  In between now and then, hopefully he will go to the Jeff Aguirre and donate.  Ivor Mars, MD 4/23/202510:31 AM

## 2023-09-16 NOTE — Progress Notes (Signed)
 Jeff Aguirre presents today for phlebotomy per MD orders. Phlebotomy procedure started at 1057 via left ac by Erich Haus RN and ended at 1106. 550 grams removed. Snack and drink taken.  Patient tolerated procedure well. IV needle removed intact. Pt refused to stay for 30 minutes post phlebotomy and is without complaints at time of discharge.

## 2023-09-21 ENCOUNTER — Encounter: Payer: Self-pay | Admitting: Gastroenterology

## 2023-09-30 ENCOUNTER — Inpatient Hospital Stay

## 2023-09-30 ENCOUNTER — Inpatient Hospital Stay: Attending: Hematology & Oncology

## 2023-09-30 LAB — CMP (CANCER CENTER ONLY)
ALT: 41 U/L (ref 0–44)
AST: 44 U/L — ABNORMAL HIGH (ref 15–41)
Albumin: 4.4 g/dL (ref 3.5–5.0)
Alkaline Phosphatase: 110 U/L (ref 38–126)
Anion gap: 6 (ref 5–15)
BUN: 6 mg/dL — ABNORMAL LOW (ref 8–23)
CO2: 29 mmol/L (ref 22–32)
Calcium: 9.4 mg/dL (ref 8.9–10.3)
Chloride: 100 mmol/L (ref 98–111)
Creatinine: 0.87 mg/dL (ref 0.61–1.24)
GFR, Estimated: >60 mL/min (ref >60)
Glucose, Bld: 129 mg/dL — ABNORMAL HIGH (ref 70–99)
Potassium: 4.4 mmol/L (ref 3.5–5.1)
Sodium: 135 mmol/L (ref 135–145)
Total Bilirubin: 0.4 mg/dL (ref 0.0–1.2)
Total Protein: 6.9 g/dL (ref 6.5–8.1)

## 2023-09-30 LAB — CBC WITH DIFFERENTIAL (CANCER CENTER ONLY)
Abs Immature Granulocytes: 0.03 10*3/uL (ref 0.00–0.07)
Basophils Absolute: 0 10*3/uL (ref 0.0–0.1)
Basophils Relative: 1 %
Eosinophils Absolute: 0.1 10*3/uL (ref 0.0–0.5)
Eosinophils Relative: 3 %
HCT: 50.4 % (ref 39.0–52.0)
Hemoglobin: 17.5 g/dL — ABNORMAL HIGH (ref 13.0–17.0)
Immature Granulocytes: 1 %
Lymphocytes Relative: 33 %
Lymphs Abs: 1.8 10*3/uL (ref 0.7–4.0)
MCH: 35.3 pg — ABNORMAL HIGH (ref 26.0–34.0)
MCHC: 34.7 g/dL (ref 30.0–36.0)
MCV: 101.6 fL — ABNORMAL HIGH (ref 80.0–100.0)
Monocytes Absolute: 0.4 10*3/uL (ref 0.1–1.0)
Monocytes Relative: 8 %
Neutro Abs: 2.9 10*3/uL (ref 1.7–7.7)
Neutrophils Relative %: 54 %
Platelet Count: 162 10*3/uL (ref 150–400)
RBC: 4.96 MIL/uL (ref 4.22–5.81)
RDW: 13.3 % (ref 11.5–15.5)
WBC Count: 5.3 10*3/uL (ref 4.0–10.5)
nRBC: 0 % (ref 0.0–0.2)

## 2023-09-30 LAB — IRON AND IRON BINDING CAPACITY (CC-WL,HP ONLY)
Iron: 107 ug/dL (ref 45–182)
Saturation Ratios: 24 % (ref 17.9–39.5)
TIBC: 444 ug/dL (ref 250–450)
UIBC: 337 ug/dL (ref 117–376)

## 2023-09-30 LAB — FERRITIN: Ferritin: 262 ng/mL (ref 24–336)

## 2023-09-30 NOTE — Patient Instructions (Signed)

## 2023-09-30 NOTE — Progress Notes (Signed)
 Melvia Stacks presents today for phlebotomy per MD orders. Phlebotomy procedure started at 1250 and ended at 1300 using a 16 gauge needle to left AC. Phlebotomy kit used for procedure. 550 grams removed.  Snacks given.   Pt declined to stay for full 30 minute post procedure observation period. Pt states he has had the procedure multiple times prior and has always tolerated them well.  Patient tolerated procedure well. IV needle removed intact.

## 2023-09-30 NOTE — Progress Notes (Signed)
 error

## 2023-10-13 ENCOUNTER — Other Ambulatory Visit: Payer: Self-pay

## 2023-10-14 ENCOUNTER — Inpatient Hospital Stay

## 2023-10-14 LAB — CMP (CANCER CENTER ONLY)
ALT: 37 U/L (ref 0–44)
AST: 44 U/L — ABNORMAL HIGH (ref 15–41)
Albumin: 4.6 g/dL (ref 3.5–5.0)
Alkaline Phosphatase: 99 U/L (ref 38–126)
Anion gap: 8 (ref 5–15)
BUN: <5 mg/dL — ABNORMAL LOW (ref 8–23)
CO2: 28 mmol/L (ref 22–32)
Calcium: 9.6 mg/dL (ref 8.9–10.3)
Chloride: 97 mmol/L — ABNORMAL LOW (ref 98–111)
Creatinine: 0.74 mg/dL (ref 0.61–1.24)
GFR, Estimated: >60 mL/min (ref >60)
Glucose, Bld: 123 mg/dL — ABNORMAL HIGH (ref 70–99)
Potassium: 4.1 mmol/L (ref 3.5–5.1)
Sodium: 133 mmol/L — ABNORMAL LOW (ref 135–145)
Total Bilirubin: 0.7 mg/dL (ref 0.0–1.2)
Total Protein: 6.9 g/dL (ref 6.5–8.1)

## 2023-10-14 LAB — CBC WITH DIFFERENTIAL (CANCER CENTER ONLY)
Abs Immature Granulocytes: 0.02 10*3/uL (ref 0.00–0.07)
Basophils Absolute: 0.1 10*3/uL (ref 0.0–0.1)
Basophils Relative: 1 %
Eosinophils Absolute: 0.2 10*3/uL (ref 0.0–0.5)
Eosinophils Relative: 4 %
HCT: 49.2 % (ref 39.0–52.0)
Hemoglobin: 17.2 g/dL — ABNORMAL HIGH (ref 13.0–17.0)
Immature Granulocytes: 0 %
Lymphocytes Relative: 37 %
Lymphs Abs: 2 10*3/uL (ref 0.7–4.0)
MCH: 34.7 pg — ABNORMAL HIGH (ref 26.0–34.0)
MCHC: 35 g/dL (ref 30.0–36.0)
MCV: 99.2 fL (ref 80.0–100.0)
Monocytes Absolute: 0.4 10*3/uL (ref 0.1–1.0)
Monocytes Relative: 8 %
Neutro Abs: 2.7 10*3/uL (ref 1.7–7.7)
Neutrophils Relative %: 50 %
Platelet Count: 198 10*3/uL (ref 150–400)
RBC: 4.96 MIL/uL (ref 4.22–5.81)
RDW: 13 % (ref 11.5–15.5)
WBC Count: 5.4 10*3/uL (ref 4.0–10.5)
nRBC: 0 % (ref 0.0–0.2)

## 2023-10-14 MED ORDER — SODIUM CHLORIDE 0.9 % IV SOLN: Freq: Once | INTRAVENOUS | Status: DC

## 2023-10-14 NOTE — Patient Instructions (Signed)

## 2023-10-14 NOTE — Progress Notes (Signed)
 One unit phlebotomy taken from left [name] without difficulty...Jeff AasAaron Aasend bag weight was 540 gms.  Site unremarkable and nourishments given. Tolerated procedure well.

## 2023-10-23 ENCOUNTER — Encounter: Payer: Self-pay | Admitting: Family

## 2023-10-27 ENCOUNTER — Ambulatory Visit (AMBULATORY_SURGERY_CENTER)

## 2023-10-27 ENCOUNTER — Other Ambulatory Visit: Payer: Self-pay

## 2023-10-27 VITALS — Ht 70.0 in | Wt 165.0 lb

## 2023-10-27 DIAGNOSIS — Z860100 Personal history of colon polyps, unspecified: Secondary | ICD-10-CM

## 2023-10-27 MED ORDER — NA SULFATE-K SULFATE-MG SULF 17.5-3.13-1.6 GM/177ML PO SOLN: 1.0000 | Freq: Once | ORAL | 0 refills | Status: AC

## 2023-10-27 NOTE — Progress Notes (Signed)
No egg or soy allergy known to patient  No issues known to pt with past sedation with any surgeries or procedures Patient denies ever being told they had issues or difficulty with intubation  No FH of Malignant Hyperthermia Pt is not on diet pills Pt is not on home 02  Pt is not on blood thinners  Pt denies issues with chronic constipation  No A fib or A flutter Have any cardiac testing pending--no Pt instructed to use Singlecare.[email or web address] or GoodRx for a price reduction on prep  Ambulates independently

## 2023-10-28 ENCOUNTER — Inpatient Hospital Stay: Attending: Family

## 2023-10-28 ENCOUNTER — Inpatient Hospital Stay

## 2023-10-28 DIAGNOSIS — I10 Essential (primary) hypertension: Secondary | ICD-10-CM | POA: Insufficient documentation

## 2023-10-28 DIAGNOSIS — Z7982 Long term (current) use of aspirin: Secondary | ICD-10-CM | POA: Diagnosis not present

## 2023-10-28 DIAGNOSIS — F17210 Nicotine dependence, cigarettes, uncomplicated: Secondary | ICD-10-CM | POA: Diagnosis not present

## 2023-10-28 LAB — CMP (CANCER CENTER ONLY)
ALT: 43 U/L (ref 0–44)
AST: 60 U/L — ABNORMAL HIGH (ref 15–41)
Albumin: 4.4 g/dL (ref 3.5–5.0)
Alkaline Phosphatase: 105 U/L (ref 38–126)
Anion gap: 7 (ref 5–15)
BUN: <5 mg/dL — ABNORMAL LOW (ref 8–23)
CO2: 27 mmol/L (ref 22–32)
Calcium: 9.7 mg/dL (ref 8.9–10.3)
Chloride: 98 mmol/L (ref 98–111)
Creatinine: 0.76 mg/dL (ref 0.61–1.24)
GFR, Estimated: >60 mL/min (ref >60)
Glucose, Bld: 124 mg/dL — ABNORMAL HIGH (ref 70–99)
Potassium: 4.1 mmol/L (ref 3.5–5.1)
Sodium: 132 mmol/L — ABNORMAL LOW (ref 135–145)
Total Bilirubin: 0.6 mg/dL (ref 0.0–1.2)
Total Protein: 7.2 g/dL (ref 6.5–8.1)

## 2023-10-28 LAB — CBC WITH DIFFERENTIAL (CANCER CENTER ONLY)
Abs Immature Granulocytes: 0.03 10*3/uL (ref 0.00–0.07)
Basophils Absolute: 0.1 10*3/uL (ref 0.0–0.1)
Basophils Relative: 1 %
Eosinophils Absolute: 0.2 10*3/uL (ref 0.0–0.5)
Eosinophils Relative: 3 %
HCT: 49 % (ref 39.0–52.0)
Hemoglobin: 17.6 g/dL — ABNORMAL HIGH (ref 13.0–17.0)
Immature Granulocytes: 1 %
Lymphocytes Relative: 29 %
Lymphs Abs: 1.6 10*3/uL (ref 0.7–4.0)
MCH: 34.9 pg — ABNORMAL HIGH (ref 26.0–34.0)
MCHC: 35.9 g/dL (ref 30.0–36.0)
MCV: 97 fL (ref 80.0–100.0)
Monocytes Absolute: 0.4 10*3/uL (ref 0.1–1.0)
Monocytes Relative: 8 %
Neutro Abs: 3.3 10*3/uL (ref 1.7–7.7)
Neutrophils Relative %: 58 %
Platelet Count: 204 10*3/uL (ref 150–400)
RBC: 5.05 MIL/uL (ref 4.22–5.81)
RDW: 12.5 % (ref 11.5–15.5)
WBC Count: 5.6 10*3/uL (ref 4.0–10.5)
nRBC: 0 % (ref 0.0–0.2)

## 2023-10-28 NOTE — Progress Notes (Signed)
 1 unit therapeutic phlebotomy performed over 7 minutes using a 16 gauge phlebotomy via the left AC. Patent tolerated well. Nourishment provided.  Patient does not want to stay for the recommended 30 minute post phlebotomy observation. VSS. Patient discharged ambulatory without complaints or concerns.

## 2023-10-28 NOTE — Patient Instructions (Signed)

## 2023-11-03 ENCOUNTER — Encounter: Payer: Self-pay | Admitting: Gastroenterology

## 2023-11-05 ENCOUNTER — Other Ambulatory Visit: Payer: Self-pay | Admitting: Family Medicine

## 2023-11-05 DIAGNOSIS — I10 Essential (primary) hypertension: Secondary | ICD-10-CM

## 2023-11-10 ENCOUNTER — Other Ambulatory Visit: Payer: Self-pay

## 2023-11-10 DIAGNOSIS — D751 Secondary polycythemia: Secondary | ICD-10-CM

## 2023-11-10 DIAGNOSIS — D500 Iron deficiency anemia secondary to blood loss (chronic): Secondary | ICD-10-CM

## 2023-11-11 ENCOUNTER — Encounter: Payer: Self-pay | Admitting: Medical Oncology

## 2023-11-11 ENCOUNTER — Inpatient Hospital Stay

## 2023-11-11 ENCOUNTER — Inpatient Hospital Stay: Admitting: Medical Oncology

## 2023-11-11 VITALS — BP 130/65 | HR 86 | Temp 97.7°F | Resp 18 | Ht 70.0 in | Wt 166.8 lb

## 2023-11-11 DIAGNOSIS — D500 Iron deficiency anemia secondary to blood loss (chronic): Secondary | ICD-10-CM

## 2023-11-11 DIAGNOSIS — D751 Secondary polycythemia: Secondary | ICD-10-CM

## 2023-11-11 DIAGNOSIS — Z7982 Long term (current) use of aspirin: Secondary | ICD-10-CM | POA: Diagnosis not present

## 2023-11-11 DIAGNOSIS — Z1589 Genetic susceptibility to other disease: Secondary | ICD-10-CM

## 2023-11-11 DIAGNOSIS — F17210 Nicotine dependence, cigarettes, uncomplicated: Secondary | ICD-10-CM | POA: Diagnosis not present

## 2023-11-11 DIAGNOSIS — I10 Essential (primary) hypertension: Secondary | ICD-10-CM | POA: Diagnosis not present

## 2023-11-11 LAB — CBC WITH DIFFERENTIAL (CANCER CENTER ONLY)
Abs Immature Granulocytes: 0.04 10*3/uL (ref 0.00–0.07)
Basophils Absolute: 0.1 10*3/uL (ref 0.0–0.1)
Basophils Relative: 1 %
Eosinophils Absolute: 0.3 10*3/uL (ref 0.0–0.5)
Eosinophils Relative: 5 %
HCT: 49.5 % (ref 39.0–52.0)
Hemoglobin: 17 g/dL (ref 13.0–17.0)
Immature Granulocytes: 1 %
Lymphocytes Relative: 35 %
Lymphs Abs: 2 10*3/uL (ref 0.7–4.0)
MCH: 34.1 pg — ABNORMAL HIGH (ref 26.0–34.0)
MCHC: 34.3 g/dL (ref 30.0–36.0)
MCV: 99.2 fL (ref 80.0–100.0)
Monocytes Absolute: 0.5 10*3/uL (ref 0.1–1.0)
Monocytes Relative: 8 %
Neutro Abs: 3 10*3/uL (ref 1.7–7.7)
Neutrophils Relative %: 50 %
Platelet Count: 212 10*3/uL (ref 150–400)
RBC: 4.99 MIL/uL (ref 4.22–5.81)
RDW: 12.4 % (ref 11.5–15.5)
WBC Count: 5.8 10*3/uL (ref 4.0–10.5)
nRBC: 0 % (ref 0.0–0.2)

## 2023-11-11 LAB — CMP (CANCER CENTER ONLY)
ALT: 34 U/L (ref 0–44)
AST: 38 U/L (ref 15–41)
Albumin: 4.4 g/dL (ref 3.5–5.0)
Alkaline Phosphatase: 115 U/L (ref 38–126)
Anion gap: 7 (ref 5–15)
BUN: 5 mg/dL — ABNORMAL LOW (ref 8–23)
CO2: 28 mmol/L (ref 22–32)
Calcium: 9.4 mg/dL (ref 8.9–10.3)
Chloride: 101 mmol/L (ref 98–111)
Creatinine: 0.85 mg/dL (ref 0.61–1.24)
GFR, Estimated: >60 mL/min (ref >60)
Glucose, Bld: 110 mg/dL — ABNORMAL HIGH (ref 70–99)
Potassium: 4.3 mmol/L (ref 3.5–5.1)
Sodium: 136 mmol/L (ref 135–145)
Total Bilirubin: 0.4 mg/dL (ref 0.0–1.2)
Total Protein: 7 g/dL (ref 6.5–8.1)

## 2023-11-11 LAB — IRON AND IRON BINDING CAPACITY (CC-WL,HP ONLY)
Iron: 80 ug/dL (ref 45–182)
Saturation Ratios: 16 % — ABNORMAL LOW (ref 17.9–39.5)
TIBC: 489 ug/dL — ABNORMAL HIGH (ref 250–450)
UIBC: 409 ug/dL — ABNORMAL HIGH (ref 117–376)

## 2023-11-11 LAB — FERRITIN: Ferritin: 151 ng/mL (ref 24–336)

## 2023-11-11 NOTE — Progress Notes (Signed)
 Jeff Aguirre presents today for phlebotomy per MD orders. Phlebotomy procedure started at 1055 and ended at 1105 500 grams removed. Patient observed for 30 minutes after procedure without any incident. Patient tolerated procedure well. IV needle removed intact.

## 2023-11-11 NOTE — Patient Instructions (Signed)

## 2023-11-11 NOTE — Progress Notes (Signed)
 Hematology and Oncology Follow Up Visit  Jeff Aguirre [id number] 1961-03-14 63 y.o. 11/11/2023   Principle Diagnosis:  Hemochromatosis, heterozygous for the H63D mutation JAK2 negative but did have tier II variants of potential significance (ASXL1 p.Lys523Ter and  TP53 p.[id number])  Current Therapy:   Phlebotomy to maintain Hct < 45%, iron saturation < 50% and ferritin < 100 EC ASA 81 mg p.o. daily   Interim History:  Jeff Aguirre is here today for follow-up.  He reports that he is doing well.   His last donation/phlebotomy was on 10/28/2023. He tolerates them well.   He has had no issues with nausea or vomiting.  No headaches, rash, abdominal pain.   He has had no change in bowel or bladder habits. He smokes 4 cigarettes a day.   He does take aspirin .  Overall, his performance status is probably ECOG 0.  Wt Readings from Last 3 Encounters:  11/11/23 166 lb 12.8 oz (75.7 kg)  10/27/23 165 lb (74.8 kg)  09/16/23 165 lb 12.8 oz (75.2 kg)    Medications:  Allergies as of 11/11/2023       Reactions   Morphine Itching        Medication List        Accurate as of November 11, 2023 10:30 AM. If you have any questions, ask your nurse or doctor.          Aspir-Low 81 MG tablet Generic drug: aspirin  EC Take 81 mg by mouth daily.   lisinopril  5 MG tablet Commonly known as: ZESTRIL  Take 1 tablet (5 mg total) by mouth daily.   metoprolol  succinate 50 MG 24 hr tablet Commonly known as: TOPROL -XL Take 1 tablet by mouth once daily   Na Sulfate-K Sulfate-Mg Sulfate concentrate 17.5-3.13-1.6 GM/177ML Soln Commonly known as: SUPREP        Allergies:  Allergies  Allergen Reactions   Morphine Itching    Past Medical History, Surgical history, Social history, and Family History were reviewed and updated.  Review of Systems:   Review of Systems  Constitutional: Negative.   HENT: Negative.    Eyes: Negative.   Respiratory: Negative.    Cardiovascular:  Negative.   Gastrointestinal: Negative.   Genitourinary: Negative.   Musculoskeletal: Negative.   Skin: Negative.   Neurological: Negative.   Endo/Heme/Allergies: Negative.   Psychiatric/Behavioral: Negative.      Physical Exam:  height is 5' 10 (1.778 m) and weight is 166 lb 12.8 oz (75.7 kg). His oral temperature is 97.7 F (36.5 C). His blood pressure is 130/65 and his pulse is 86. His respiration is 18 and oxygen saturation is 100%.   Wt Readings from Last 3 Encounters:  11/11/23 166 lb 12.8 oz (75.7 kg)  10/27/23 165 lb (74.8 kg)  09/16/23 165 lb 12.8 oz (75.2 kg)    Physical Exam Vitals reviewed.  HENT:     Head: Normocephalic and atraumatic.   Eyes:     Pupils: Pupils are equal, round, and reactive to light.    Cardiovascular:     Rate and Rhythm: Normal rate and regular rhythm.     Heart sounds: Normal heart sounds.  Pulmonary:     Effort: Pulmonary effort is normal.     Breath sounds: Normal breath sounds.  Abdominal:     General: Bowel sounds are normal.     Palpations: Abdomen is soft.   Musculoskeletal:        General: No tenderness or deformity. Normal range of motion.  Cervical back: Normal range of motion.  Lymphadenopathy:     Cervical: No cervical adenopathy.   Skin:    General: Skin is warm and dry.     Findings: No erythema or rash.   Neurological:     Mental Status: He is alert and oriented to person, place, and time.   Psychiatric:        Behavior: Behavior normal.        Thought Content: Thought content normal.        Judgment: Judgment normal.     Lab Results  Component Value Date   WBC 5.8 11/11/2023   HGB 17.0 11/11/2023   HCT 49.5 11/11/2023   MCV 99.2 11/11/2023   PLT 212 11/11/2023   Lab Results  Component Value Date   FERRITIN 262 09/30/2023   IRON 107 09/30/2023   TIBC 444 09/30/2023   UIBC 337 09/30/2023   IRONPCTSAT 24 09/30/2023   Lab Results  Component Value Date   RETICCTPCT 1.8 06/16/2022   RBC 4.99  11/11/2023   No results found for: KPAFRELGTCHN, LAMBDASER, KAPLAMBRATIO No results found for: IGGSERUM, IGA, IGMSERUM No results found for: Anthoney Batch, GAMS, MSPIKE, SPEI   Chemistry      Component Value Date/Time   NA 132 (L) 10/28/2023 1227   K 4.1 10/28/2023 1227   CL 98 10/28/2023 1227   CO2 27 10/28/2023 1227   BUN <5 (L) 10/28/2023 1227   CREATININE 0.76 10/28/2023 1227   CREATININE 0.64 (L) 10/25/2020 0000      Component Value Date/Time   CALCIUM  9.7 10/28/2023 1227   ALKPHOS 105 10/28/2023 1227   AST 60 (H) 10/28/2023 1227   ALT 43 10/28/2023 1227   BILITOT 0.6 10/28/2023 1227     No diagnosis found.   Impression and Plan: Jeff Aguirre is a very pleasant 63 yo caucasian gentleman with history of hemochromatosis, heterozygous for the H63D mutation and a year long history of erythrocytosis, JAK 2 negative but was noted to have a couple other variant of potential significance. He does smoke.   Our goal right now is to make sure we phlebotomize him.  Again, he can donate to the [employer].    Today his Hgb is down to 17, HCT is 49.5.  His last iron studied on 09/30/2023 showed a ferritin of 262 and a iron saturation of 24% He may have some baseline false elevation of ferritin- we will monitor.  Iron studies are pending.  CMP pending.  He is due for a TSH as his last was over 3 years ago.  Last abdominal US  was on 04/29/2016- will discuss getting this updated at his next visit   Phlebotomy today RTC 2 weeks APP, labs(CBC, CMP, iron, ferritin, TSH), phlebotomy   Sharla Davis, PA-C 6/18/202510:30 AM

## 2023-11-13 LAB — TSH: TSH: 1.07 u[IU]/mL (ref 0.350–4.500)

## 2023-11-16 ENCOUNTER — Ambulatory Visit: Payer: Self-pay | Admitting: Medical Oncology

## 2023-11-23 ENCOUNTER — Ambulatory Visit: Admitting: Gastroenterology

## 2023-11-23 ENCOUNTER — Encounter: Payer: Self-pay | Admitting: Gastroenterology

## 2023-11-23 VITALS — BP 104/61 | HR 110 | Temp 97.5°F | Resp 22 | Ht 70.0 in | Wt 165.0 lb

## 2023-11-23 DIAGNOSIS — D123 Benign neoplasm of transverse colon: Secondary | ICD-10-CM | POA: Diagnosis not present

## 2023-11-23 DIAGNOSIS — D122 Benign neoplasm of ascending colon: Secondary | ICD-10-CM | POA: Diagnosis not present

## 2023-11-23 DIAGNOSIS — K648 Other hemorrhoids: Secondary | ICD-10-CM

## 2023-11-23 DIAGNOSIS — K5730 Diverticulosis of large intestine without perforation or abscess without bleeding: Secondary | ICD-10-CM | POA: Diagnosis not present

## 2023-11-23 DIAGNOSIS — Z860100 Personal history of colon polyps, unspecified: Secondary | ICD-10-CM

## 2023-11-23 DIAGNOSIS — I10 Essential (primary) hypertension: Secondary | ICD-10-CM | POA: Diagnosis not present

## 2023-11-23 DIAGNOSIS — Z1211 Encounter for screening for malignant neoplasm of colon: Secondary | ICD-10-CM | POA: Diagnosis not present

## 2023-11-23 DIAGNOSIS — K635 Polyp of colon: Secondary | ICD-10-CM | POA: Diagnosis not present

## 2023-11-23 DIAGNOSIS — K644 Residual hemorrhoidal skin tags: Secondary | ICD-10-CM

## 2023-11-23 MED ORDER — SODIUM CHLORIDE 0.9 % IV SOLN: 500.0000 mL | Freq: Once | INTRAVENOUS | Status: DC

## 2023-11-23 NOTE — Progress Notes (Signed)
 Pt's states no medical or surgical changes since previsit or office visit.

## 2023-11-23 NOTE — Op Note (Signed)
 [name] Procedure Date: 11/23/2023 10:58 AM MRN: [id number] Endoscopist: Gustav ALONSO Mcgee , MD, [id number] Age: 63 Referring MD:  Date of Birth: [birth year] Gender: Male Account #: [id number] Procedure:                Colonoscopy Indications:              High risk colon cancer surveillance: Personal                            history of colonic polyps, High risk colon cancer                            surveillance: Personal history of adenoma (10 mm or                            greater in size), High risk colon cancer                            surveillance: Personal history of multiple (3 or                            more) adenomas Medicines:                Monitored Anesthesia Care Procedure:                Pre-Anesthesia Assessment:                           - Prior to the procedure, a History and Physical                            was performed, and patient medications and                            allergies were reviewed. The patient's tolerance of                            previous anesthesia was also reviewed. The risks                            and benefits of the procedure and the sedation                            options and risks were discussed with the patient.                            All questions were answered, and informed consent                            was obtained. Prior Anticoagulants: The patient has                            taken no anticoagulant or antiplatelet agents. ASA  Grade Assessment: II - A patient with mild systemic                            disease. After reviewing the risks and benefits,                            the patient was deemed in satisfactory condition to                            undergo the procedure.                           After obtaining informed consent, the colonoscope                            was passed under direct vision. Throughout the                             procedure, the patient's blood pressure, pulse, and                            oxygen saturations were monitored continuously. The                            Olympus Scope SN [id number] was introduced through the                            anus and advanced to the the cecum, identified by                            appendiceal orifice and ileocecal valve. The                            colonoscopy was performed without difficulty. The                            patient tolerated the procedure well. The quality                            of the bowel preparation was good. The ileocecal                            valve, appendiceal orifice, and rectum were                            photographed. Scope In: 11:28:49 AM Scope Out: 11:45:52 AM Scope Withdrawal Time: 0 hours 14 minutes 25 seconds  Total Procedure Duration: 0 hours 17 minutes 3 seconds  Findings:                 The perianal and digital rectal examinations were                            normal.  Four sessile polyps were found in the transverse                            colon and ascending colon. The polyps were 4 to 8                            mm in size. These polyps were removed with a cold                            snare. Resection and retrieval were complete.                           Scattered medium-mouthed and small-mouthed                            diverticula were found in the sigmoid colon and                            descending colon.                           Non-bleeding external and internal hemorrhoids were                            found during retroflexion. The hemorrhoids were                            medium-sized. Complications:            No immediate complications. Estimated Blood Loss:     Estimated blood loss was minimal. Impression:               - Four 4 to 8 mm polyps in the transverse colon and                            in the ascending colon,  removed with a cold snare.                            Resected and retrieved.                           - Diverticulosis in the sigmoid colon and in the                            descending colon.                           - Non-bleeding external and internal hemorrhoids. Recommendation:           - Patient has a contact number available for                            emergencies. The signs and symptoms of potential                            delayed  complications were discussed with the                            patient. Return to normal activities tomorrow.                            Written discharge instructions were provided to the                            patient.                           - Resume previous diet.                           - Continue present medications.                           - Await pathology results.                           - Repeat colonoscopy in 3 years for surveillance                            based on pathology results. Hesper Venturella V. Marai Teehan, MD 11/23/2023 11:56:28 AM This report has been signed electronically.

## 2023-11-23 NOTE — Progress Notes (Unsigned)
 Called to room to assist during endoscopic procedure.  Patient ID and intended procedure confirmed with present staff. Received instructions for my participation in the procedure from the performing physician.

## 2023-11-23 NOTE — Patient Instructions (Signed)

## 2023-11-23 NOTE — Progress Notes (Unsigned)
 Sedate, gd SR, tolerated procedure well, VSS, report to RN

## 2023-11-23 NOTE — Progress Notes (Unsigned)
 [facility] History and Physical   Primary Care Physician:  Almarie Waddell NOVAK, NP   Reason for Procedure:  History of adenomatous colon polyps  Plan:    Surveillance colonoscopy with possible interventions as needed     HPI: Jeff Aguirre is a very pleasant 63 y.o. male here for surveillance colonoscopy. Denies any nausea, vomiting, abdominal pain, melena or bright red blood per rectum  The risks and benefits as well as alternatives of endoscopic procedure(s) have been discussed and reviewed. All questions answered. The patient agrees to proceed.    Past Medical History:  Diagnosis Date   Anxiety    Colon polyps    Hypertension     Past Surgical History:  Procedure Laterality Date   COLONOSCOPY     growth on testicle removed  2000's   benign   LUNG SURGERY      Prior to Admission medications   Medication Sig Start Date End Date Taking? Authorizing Provider  aspirin  EC (ASPIR-LOW) 81 MG tablet Take 81 mg by mouth daily. 06/17/22  Yes [provider]  lisinopril  (ZESTRIL ) 5 MG tablet Take 1 tablet (5 mg total) by mouth daily. 11/05/23  Yes Almarie Waddell NOVAK, NP  metoprolol  succinate (TOPROL -XL) 50 MG 24 hr tablet Take 1 tablet by mouth once daily 08/27/23  Yes Beck, Taylor B, NP  Na Sulfate-K Sulfate-Mg Sulfate concentrate (SUPREP) 17.5-3.13-1.6 GM/177ML SOLN  10/27/23  Yes [provider]    Current Outpatient Medications  Medication Sig Dispense Refill   aspirin  EC (ASPIR-LOW) 81 MG tablet Take 81 mg by mouth daily.     lisinopril  (ZESTRIL ) 5 MG tablet Take 1 tablet (5 mg total) by mouth daily. 90 tablet 1   metoprolol  succinate (TOPROL -XL) 50 MG 24 hr tablet Take 1 tablet by mouth once daily 90 tablet 0   Na Sulfate-K Sulfate-Mg Sulfate concentrate (SUPREP) 17.5-3.13-1.6 GM/177ML SOLN      Current Facility-Administered Medications  Medication Dose Route Frequency Provider Last Rate Last Admin   0.9 %  sodium chloride  infusion  500 mL  Intravenous Once Shareen Capwell V, MD        Allergies as of 11/23/2023 - Review Complete 11/23/2023  Allergen Reaction Noted   Morphine Itching 06/16/2022    Family History  Problem Relation Age of Onset   Diabetes Mother    Hypertension Mother    Cancer Mother        breast   Diabetes Father    Hypertension Father    Heart disease Father    Diabetes Sister    Hypertension Sister    Diabetes Brother    Hypertension Brother    Colon cancer Neg Hx    Esophageal cancer Neg Hx    Rectal cancer Neg Hx    Stomach cancer Neg Hx     Social History   Socioeconomic History   Marital status: Widowed    Spouse name: Not on file   Number of children: 1   Years of education: 109   Highest education level: 12th grade  Occupational History   Occupation: Production designer, theatre/television/film  Tobacco Use   Smoking status: Every Day    Current packs/day: 0.50    Average packs/day: 0.5 packs/day for 35.0 years (17.5 ttl pk-yrs)    Types: Cigarettes   Smokeless tobacco: Former    Types: Chew    Quit date: 05/26/1976   Tobacco comments:    09/16/2023    Smokes 4 cigarettes/day.  Vaping Use   Vaping status:  Never Used  Substance and Sexual Activity   Alcohol use: Yes    Alcohol/week: 12.0 - 18.0 standard drinks of alcohol    Types: 12 - 18 Cans of beer per week   Drug use: No   Sexual activity: Yes    Birth control/protection: None  Other Topics Concern   Not on file  Social History Narrative   Lives alone in an apartment on the 2nd floor.  No children.  Works as a Production designer, theatre/television/film for [employer].  Education: high school.    Social Drivers of Corporate investment banker Strain: Low Risk  (07/25/2023)   Overall Financial Resource Strain (CARDIA)    Difficulty of Paying Living Expenses: Not hard at all  Food Insecurity: No Food Insecurity (07/25/2023)   Hunger Vital Sign    Worried About Running Out of Food in the Last Year: Never true    Ran Out of Food in the Last Year: Never true   Transportation Needs: No Transportation Needs (07/25/2023)   PRAPARE - Administrator, Civil Service (Medical): No    Lack of Transportation (Non-Medical): No  Physical Activity: Insufficiently Active (07/25/2023)   Exercise Vital Sign    Days of Exercise per Week: 2 days    Minutes of Exercise per Session: 10 min  Stress: No Stress Concern Present (07/25/2023)   [employer] of Occupational Health - Occupational Stress Questionnaire    Feeling of Stress : Not at all  Social Connections: Socially Isolated (07/25/2023)   Social Connection and Isolation Panel    Frequency of Communication with Friends and Family: Once a week    Frequency of Social Gatherings with Friends and Family: Once a week    Attends Religious Services: 1 to 4 times per year    Active Member of [employer] or Organizations: No    Attends Banker Meetings: Not on file    Marital Status: Widowed  Intimate Partner Violence: Not on file    Review of Systems:  All other review of systems negative except as mentioned in the HPI.  Physical Exam: Vital signs in last 24 hours: BP 136/85   Pulse 90   Temp (!) 97.5 F (36.4 C) (Temporal)   Resp (!) 21   Ht 5' 10 (1.778 m)   Wt 165 lb (74.8 kg)   SpO2 (!) 85%   BMI 23.68 kg/m  General:   Alert, NAD Lungs:  Clear .   Heart:  Regular rate and rhythm Abdomen:  Soft, nontender and nondistended. Neuro/Psych:  Alert and cooperative. Normal mood and affect. A and O x 3  Reviewed labs, radiology imaging, old records and pertinent past GI work up  Patient is appropriate for planned procedure(s) and anesthesia in an ambulatory setting   K. Veena Calah Gershman , MD [phone]

## 2023-11-24 ENCOUNTER — Telehealth: Payer: Self-pay

## 2023-11-24 NOTE — Telephone Encounter (Signed)
  Follow up Call-     11/23/2023   10:31 AM  Call back number  Post procedure Call Back phone  # [phone]  Permission to leave phone message Yes     Patient questions:  Do you have a fever, pain , or abdominal swelling? No. Pain Score  0 *  Have you tolerated food without any problems? Yes.    Have you been able to return to your normal activities? Yes.    Do you have any questions about your discharge instructions: Diet   No. Medications  No. Follow up visit  No.  Do you have questions or concerns about your Care? No.  Actions: * If pain score is 4 or above: No action needed, pain <4.

## 2023-11-26 ENCOUNTER — Inpatient Hospital Stay

## 2023-11-26 ENCOUNTER — Inpatient Hospital Stay: Admitting: Medical Oncology

## 2023-11-26 ENCOUNTER — Inpatient Hospital Stay: Attending: Hematology & Oncology

## 2023-11-26 DIAGNOSIS — D500 Iron deficiency anemia secondary to blood loss (chronic): Secondary | ICD-10-CM

## 2023-11-26 DIAGNOSIS — Z7982 Long term (current) use of aspirin: Secondary | ICD-10-CM | POA: Insufficient documentation

## 2023-11-26 DIAGNOSIS — Z1589 Genetic susceptibility to other disease: Secondary | ICD-10-CM | POA: Diagnosis not present

## 2023-11-26 DIAGNOSIS — F17210 Nicotine dependence, cigarettes, uncomplicated: Secondary | ICD-10-CM | POA: Diagnosis not present

## 2023-11-26 DIAGNOSIS — D751 Secondary polycythemia: Secondary | ICD-10-CM

## 2023-11-26 LAB — CMP (CANCER CENTER ONLY)
ALT: 37 U/L (ref 0–44)
AST: 43 U/L — ABNORMAL HIGH (ref 15–41)
Albumin: 4.3 g/dL (ref 3.5–5.0)
Alkaline Phosphatase: 102 U/L (ref 38–126)
Anion gap: 8 (ref 5–15)
BUN: <5 mg/dL — ABNORMAL LOW (ref 8–23)
CO2: 25 mmol/L (ref 22–32)
Calcium: 10.4 mg/dL — ABNORMAL HIGH (ref 8.9–10.3)
Chloride: 101 mmol/L (ref 98–111)
Creatinine: 0.75 mg/dL (ref 0.61–1.24)
GFR, Estimated: >60 mL/min (ref >60)
Glucose, Bld: 114 mg/dL — ABNORMAL HIGH (ref 70–99)
Potassium: 4.2 mmol/L (ref 3.5–5.1)
Sodium: 134 mmol/L — ABNORMAL LOW (ref 135–145)
Total Bilirubin: 0.4 mg/dL (ref 0.0–1.2)
Total Protein: 7.2 g/dL (ref 6.5–8.1)

## 2023-11-26 LAB — CBC
HCT: 47.2 % (ref 39.0–52.0)
Hemoglobin: 16.3 g/dL (ref 13.0–17.0)
MCH: 34 pg (ref 26.0–34.0)
MCHC: 34.5 g/dL (ref 30.0–36.0)
MCV: 98.5 fL (ref 80.0–100.0)
Platelets: 211 10*3/uL (ref 150–400)
RBC: 4.79 MIL/uL (ref 4.22–5.81)
RDW: 12.1 % (ref 11.5–15.5)
WBC: 5.4 10*3/uL (ref 4.0–10.5)
nRBC: 0 % (ref 0.0–0.2)

## 2023-11-26 LAB — IRON AND IRON BINDING CAPACITY (CC-WL,HP ONLY)
Iron: 59 ug/dL (ref 45–182)
Saturation Ratios: 12 % — ABNORMAL LOW (ref 17.9–39.5)
TIBC: 482 ug/dL — ABNORMAL HIGH (ref 250–450)
UIBC: 423 ug/dL — ABNORMAL HIGH (ref 117–376)

## 2023-11-26 LAB — SURGICAL PATHOLOGY

## 2023-11-26 LAB — FERRITIN: Ferritin: 165 ng/mL (ref 24–336)

## 2023-11-26 NOTE — Progress Notes (Addendum)
 Hematology and Oncology Follow Up Visit  Jeff Aguirre [id number] 1961/03/27 63 y.o. 11/26/2023   Principle Diagnosis:  Hemochromatosis, heterozygous for the H63D mutation JAK2 negative but did have tier II variants of potential significance (ASXL1 p.Lys523Ter and  TP53 p.Dzm758Eyz)  Current Therapy:   Phlebotomy to maintain Hct < 45%, iron saturation < 50% and ferritin < 100 EC ASA 81 mg p.o. daily   Interim History:  Jeff Aguirre is here today for follow-up.  Today he states that he is doing well. He has no concerns.   His last donation/phlebotomy was on 11/11/2023. He tolerates them well.   He has had no issues with nausea or vomiting.  No headaches, rash, abdominal pain.   He has had no change in bowel or bladder habits. He smokes 4 cigarettes a day.   He does take aspirin .  Had a colonoscopy on Monday in which 4 polyps were removed. Pathology pending.   Overall, his performance status is probably ECOG 0.  Wt Readings from Last 3 Encounters:  11/26/23 165 lb 1.9 oz (74.9 kg)  11/23/23 165 lb (74.8 kg)  11/11/23 166 lb 12.8 oz (75.7 kg)    Medications:  Allergies as of 11/26/2023       Reactions   Morphine Itching        Medication List        Accurate as of November 26, 2023 11:34 AM. If you have any questions, ask your nurse or doctor.          Aspir-Low 81 MG tablet Generic drug: aspirin  EC Take 81 mg by mouth daily.   lisinopril  5 MG tablet Commonly known as: ZESTRIL  Take 1 tablet (5 mg total) by mouth daily.   metoprolol  succinate 50 MG 24 hr tablet Commonly known as: TOPROL -XL Take 1 tablet by mouth once daily   Na Sulfate-K Sulfate-Mg Sulfate concentrate 17.5-3.13-1.6 GM/177ML Soln Commonly known as: SUPREP        Allergies:  Allergies  Allergen Reactions   Morphine Itching    Past Medical History, Surgical history, Social history, and Family History were reviewed and updated.  Review of Systems:   Review of Systems   Constitutional: Negative.   HENT: Negative.    Eyes: Negative.   Respiratory: Negative.    Cardiovascular: Negative.   Gastrointestinal: Negative.   Genitourinary: Negative.   Musculoskeletal: Negative.   Skin: Negative.   Neurological: Negative.   Endo/Heme/Allergies: Negative.   Psychiatric/Behavioral: Negative.      Physical Exam:  height is 5' 10 (1.778 m) and weight is 165 lb 1.9 oz (74.9 kg). His oral temperature is 98 F (36.7 C). His blood pressure is 139/79 and his pulse is 88. His respiration is 18 and oxygen saturation is 100%.   Wt Readings from Last 3 Encounters:  11/26/23 165 lb 1.9 oz (74.9 kg)  11/23/23 165 lb (74.8 kg)  11/11/23 166 lb 12.8 oz (75.7 kg)    Physical Exam Vitals reviewed.  HENT:     Head: Normocephalic and atraumatic.  Eyes:     Pupils: Pupils are equal, round, and reactive to light.  Cardiovascular:     Rate and Rhythm: Normal rate and regular rhythm.     Heart sounds: Normal heart sounds.  Pulmonary:     Effort: Pulmonary effort is normal.     Breath sounds: Normal breath sounds.  Abdominal:     General: Bowel sounds are normal.     Palpations: Abdomen is soft.  Musculoskeletal:  General: No tenderness or deformity. Normal range of motion.     Cervical back: Normal range of motion.  Lymphadenopathy:     Cervical: No cervical adenopathy.  Skin:    General: Skin is warm and dry.     Findings: No erythema or rash.  Neurological:     Mental Status: He is alert and oriented to person, place, and time.  Psychiatric:        Behavior: Behavior normal.        Thought Content: Thought content normal.        Judgment: Judgment normal.     Lab Results  Component Value Date   WBC 5.4 11/26/2023   HGB 16.3 11/26/2023   HCT 47.2 11/26/2023   MCV 98.5 11/26/2023   PLT 211 11/26/2023   Lab Results  Component Value Date   FERRITIN 151 11/11/2023   IRON 80 11/11/2023   TIBC 489 (H) 11/11/2023   UIBC 409 (H) 11/11/2023    IRONPCTSAT 16 (L) 11/11/2023   Lab Results  Component Value Date   RETICCTPCT 1.8 06/16/2022   RBC 4.79 11/26/2023   No results found for: KPAFRELGTCHN, LAMBDASER, KAPLAMBRATIO No results found for: IGGSERUM, IGA, IGMSERUM No results found for: STEPHANY CARLOTA BENSON MARKEL EARLA JOANNIE DOC VICK, SPEI   Chemistry      Component Value Date/Time   NA 136 11/11/2023 1008   K 4.3 11/11/2023 1008   CL 101 11/11/2023 1008   CO2 28 11/11/2023 1008   BUN 5 (L) 11/11/2023 1008   CREATININE 0.85 11/11/2023 1008   CREATININE 0.64 (L) 10/25/2020 0000      Component Value Date/Time   CALCIUM  9.4 11/11/2023 1008   ALKPHOS 115 11/11/2023 1008   AST 38 11/11/2023 1008   ALT 34 11/11/2023 1008   BILITOT 0.4 11/11/2023 1008     No diagnosis found.   Impression and Plan: Jeff Aguirre is a very pleasant 63 yo caucasian gentleman with history of hemochromatosis, heterozygous for the H63D mutation and a year long history of erythrocytosis, JAK 2 negative but was noted to have a couple other variant of potential significance. He does smoke.   Today his Hgb is down to 16.3, HCT is 47.2%.  His last iron studied on 11/11/2023 showed a ferritin of 151 and a iron saturation of 16% He may have some baseline false elevation of ferritin- we will monitor.  Iron studies are pending.  CMP pending.  Last TSH on 11/11/2023 was 1.070 Last abdominal US  was on 04/29/2016- order placed to be repeated  Encouraged an updated eye exam  Phlebotomy today US  abdomen placed RTC 2 weeks APP, labs(CBC, CMP, iron, ferritin, TSH), phlebotomy   Lauraine CHRISTELLA Dais, PA-C 7/3/202511:34 AM

## 2023-11-26 NOTE — Progress Notes (Signed)
 One unit phlebotomy taken from left ac area without incident.  Patient tolerated well.  End bag weight = 526 gms.  Site is unremarkable.

## 2023-11-27 ENCOUNTER — Other Ambulatory Visit: Payer: Self-pay | Admitting: Family Medicine

## 2023-11-27 DIAGNOSIS — I10 Essential (primary) hypertension: Secondary | ICD-10-CM

## 2023-12-10 ENCOUNTER — Inpatient Hospital Stay: Admitting: Medical Oncology

## 2023-12-10 ENCOUNTER — Inpatient Hospital Stay

## 2023-12-14 ENCOUNTER — Other Ambulatory Visit: Payer: Self-pay | Admitting: Medical Oncology

## 2023-12-14 ENCOUNTER — Other Ambulatory Visit (HOSPITAL_BASED_OUTPATIENT_CLINIC_OR_DEPARTMENT_OTHER)

## 2023-12-14 DIAGNOSIS — D500 Iron deficiency anemia secondary to blood loss (chronic): Secondary | ICD-10-CM

## 2023-12-14 DIAGNOSIS — D751 Secondary polycythemia: Secondary | ICD-10-CM

## 2023-12-14 DIAGNOSIS — Z1589 Genetic susceptibility to other disease: Secondary | ICD-10-CM

## 2023-12-15 ENCOUNTER — Inpatient Hospital Stay

## 2023-12-15 ENCOUNTER — Ambulatory Visit (HOSPITAL_BASED_OUTPATIENT_CLINIC_OR_DEPARTMENT_OTHER)
Admission: RE | Admit: 2023-12-15 | Discharge: 2023-12-15 | Disposition: A | Discharge status: Home / Self Care | Source: Ambulatory Visit | Attending: Medical Oncology | Admitting: Medical Oncology

## 2023-12-15 ENCOUNTER — Inpatient Hospital Stay: Admitting: Medical Oncology

## 2023-12-15 ENCOUNTER — Encounter: Payer: Self-pay | Admitting: Medical Oncology

## 2023-12-15 DIAGNOSIS — Z1589 Genetic susceptibility to other disease: Secondary | ICD-10-CM | POA: Diagnosis present

## 2023-12-15 DIAGNOSIS — Z7982 Long term (current) use of aspirin: Secondary | ICD-10-CM | POA: Diagnosis not present

## 2023-12-15 DIAGNOSIS — D751 Secondary polycythemia: Secondary | ICD-10-CM

## 2023-12-15 DIAGNOSIS — D500 Iron deficiency anemia secondary to blood loss (chronic): Secondary | ICD-10-CM

## 2023-12-15 DIAGNOSIS — F17210 Nicotine dependence, cigarettes, uncomplicated: Secondary | ICD-10-CM | POA: Diagnosis not present

## 2023-12-15 LAB — CMP (CANCER CENTER ONLY)
ALT: 41 U/L (ref 0–44)
AST: 48 U/L — ABNORMAL HIGH (ref 15–41)
Albumin: 4.6 g/dL (ref 3.5–5.0)
Alkaline Phosphatase: 107 U/L (ref 38–126)
Anion gap: 10 (ref 5–15)
BUN: <5 mg/dL — ABNORMAL LOW (ref 8–23)
CO2: 26 mmol/L (ref 22–32)
Calcium: 9.6 mg/dL (ref 8.9–10.3)
Chloride: 101 mmol/L (ref 98–111)
Creatinine: 0.76 mg/dL (ref 0.61–1.24)
GFR, Estimated: >60 mL/min (ref >60)
Glucose, Bld: 94 mg/dL (ref 70–99)
Potassium: 4.7 mmol/L (ref 3.5–5.1)
Sodium: 137 mmol/L (ref 135–145)
Total Bilirubin: 0.5 mg/dL (ref 0.0–1.2)
Total Protein: 7.6 g/dL (ref 6.5–8.1)

## 2023-12-15 LAB — CBC WITH DIFFERENTIAL (CANCER CENTER ONLY)
Abs Immature Granulocytes: 0.02 K/uL (ref 0.00–0.07)
Basophils Absolute: 0 K/uL (ref 0.0–0.1)
Basophils Relative: 1 %
Eosinophils Absolute: 0.2 K/uL (ref 0.0–0.5)
Eosinophils Relative: 4 %
HCT: 49.2 % (ref 39.0–52.0)
Hemoglobin: 16.7 g/dL (ref 13.0–17.0)
Immature Granulocytes: 0 %
Lymphocytes Relative: 30 %
Lymphs Abs: 1.6 K/uL (ref 0.7–4.0)
MCH: 32.7 pg (ref 26.0–34.0)
MCHC: 33.9 g/dL (ref 30.0–36.0)
MCV: 96.3 fL (ref 80.0–100.0)
Monocytes Absolute: 0.5 K/uL (ref 0.1–1.0)
Monocytes Relative: 9 %
Neutro Abs: 2.9 K/uL (ref 1.7–7.7)
Neutrophils Relative %: 56 %
Platelet Count: 235 K/uL (ref 150–400)
RBC: 5.11 MIL/uL (ref 4.22–5.81)
RDW: 12 % (ref 11.5–15.5)
WBC Count: 5.2 K/uL (ref 4.0–10.5)
nRBC: 0 % (ref 0.0–0.2)

## 2023-12-15 LAB — FERRITIN: Ferritin: 64 ng/mL (ref 24–336)

## 2023-12-15 LAB — IRON AND IRON BINDING CAPACITY (CC-WL,HP ONLY)
Iron: 54 ug/dL (ref 45–182)
Saturation Ratios: 9 % — ABNORMAL LOW (ref 17.9–39.5)
TIBC: 596 ug/dL — ABNORMAL HIGH (ref 250–450)
UIBC: 542 ug/dL

## 2023-12-15 LAB — RETIC PANEL
Immature Retic Fract: 14.3 % (ref 2.3–15.9)
RBC.: 5.11 MIL/uL (ref 4.22–5.81)
Retic Count, Absolute: 99.6 K/uL (ref 19.0–186.0)
Retic Ct Pct: 2 % (ref 0.4–3.1)
Reticulocyte Hemoglobin: 33.8 pg (ref >27.9)

## 2023-12-15 MED ORDER — SODIUM CHLORIDE 0.9 % IV SOLN
Freq: Once | INTRAVENOUS | Status: DC
Start: 2023-12-15 — End: 2023-12-15

## 2023-12-15 NOTE — Progress Notes (Signed)
 Hematology and Oncology Follow Up Visit  Jeff Aguirre [id number] 1960-08-17 63 y.o. 12/15/2023   Principle Diagnosis:  Hemochromatosis, heterozygous for the H63D mutation JAK2 negative but did have tier II variants of potential significance (ASXL1 p.Lys523Ter and  TP53 p.Dzm758Eyz)  Current Therapy:   Phlebotomy to maintain Hct < 45%, iron saturation < 50% and ferritin < 100 EC ASA 81 mg p.o. daily   Interim History:  Jeff Aguirre is here today for follow-up.  Today he states that he is doing well. He has no concerns. This morning he had his abdominal US . He has an eye exam scheduled soon.  He also had a colonoscopy in June which showed 4 polyps. He return in 3 years for a repeat colonoscopy.   His last donation/phlebotomy was on 11/26/2023. He tolerates them well.   He has had no issues with nausea or vomiting.  No headaches, rash, abdominal pain.   He has had no change in bowel or bladder habits. He smokes 4 cigarettes a day.   He does take aspirin .  Overall, his performance status is probably ECOG 0.  Wt Readings from Last 3 Encounters:  12/15/23 165 lb 1.9 oz (74.9 kg)  11/26/23 165 lb 1.9 oz (74.9 kg)  11/23/23 165 lb (74.8 kg)    Medications:  Allergies as of 12/15/2023       Reactions   Morphine Itching        Medication List        Accurate as of December 15, 2023 12:09 PM. If you have any questions, ask your nurse or doctor.          STOP taking these medications    Na Sulfate-K Sulfate-Mg Sulfate concentrate 17.5-3.13-1.6 GM/177ML Soln Commonly known as: SUPREP Stopped by: Lauraine CHRISTELLA Dais       TAKE these medications    Aspir-Low 81 MG tablet Generic drug: aspirin  EC Take 81 mg by mouth daily.   lisinopril  5 MG tablet Commonly known as: ZESTRIL  Take 1 tablet (5 mg total) by mouth daily.   metoprolol  succinate 50 MG 24 hr tablet Commonly known as: TOPROL -XL Take 1 tablet by mouth once daily        Allergies:  Allergies   Allergen Reactions   Morphine Itching    Past Medical History, Surgical history, Social history, and Family History were reviewed and updated.  Review of Systems:   Review of Systems  Constitutional: Negative.   HENT: Negative.    Eyes: Negative.   Respiratory: Negative.    Cardiovascular: Negative.   Gastrointestinal: Negative.   Genitourinary: Negative.   Musculoskeletal: Negative.   Skin: Negative.   Neurological: Negative.   Endo/Heme/Allergies: Negative.   Psychiatric/Behavioral: Negative.      Physical Exam:  height is 5' 10 (1.778 m) and weight is 165 lb 1.9 oz (74.9 kg). His oral temperature is 98.2 F (36.8 C). His blood pressure is 111/75 and his pulse is 83. His respiration is 18 and oxygen saturation is 100%.   Wt Readings from Last 3 Encounters:  12/15/23 165 lb 1.9 oz (74.9 kg)  11/26/23 165 lb 1.9 oz (74.9 kg)  11/23/23 165 lb (74.8 kg)    Physical Exam Vitals reviewed.  HENT:     Head: Normocephalic and atraumatic.  Eyes:     Pupils: Pupils are equal, round, and reactive to light.  Cardiovascular:     Rate and Rhythm: Normal rate and regular rhythm.     Heart sounds: Normal heart sounds.  Pulmonary:     Effort: Pulmonary effort is normal.     Breath sounds: Normal breath sounds.  Abdominal:     General: Bowel sounds are normal.     Palpations: Abdomen is soft.  Musculoskeletal:        General: No tenderness or deformity. Normal range of motion.     Cervical back: Normal range of motion.  Lymphadenopathy:     Cervical: No cervical adenopathy.  Skin:    General: Skin is warm and dry.     Findings: No erythema or rash.  Neurological:     Mental Status: He is alert and oriented to person, place, and time.  Psychiatric:        Behavior: Behavior normal.        Thought Content: Thought content normal.        Judgment: Judgment normal.     Lab Results  Component Value Date   WBC 5.2 12/15/2023   HGB 16.7 12/15/2023   HCT 49.2 12/15/2023    MCV 96.3 12/15/2023   PLT 235 12/15/2023   Lab Results  Component Value Date   FERRITIN 165 11/26/2023   IRON 59 11/26/2023   TIBC 482 (H) 11/26/2023   UIBC 423 (H) 11/26/2023   IRONPCTSAT 12 (L) 11/26/2023   Lab Results  Component Value Date   RETICCTPCT 2.0 12/15/2023   RBC 5.11 12/15/2023   No results found for: KPAFRELGTCHN, LAMBDASER, KAPLAMBRATIO No results found for: IGGSERUM, IGA, IGMSERUM No results found for: STEPHANY CARLOTA BENSON MARKEL EARLA JOANNIE DOC VICK, SPEI   Chemistry      Component Value Date/Time   NA 137 12/15/2023 1107   K 4.7 12/15/2023 1107   CL 101 12/15/2023 1107   CO2 26 12/15/2023 1107   BUN <5 (L) 12/15/2023 1107   CREATININE 0.76 12/15/2023 1107   CREATININE 0.64 (L) 10/25/2020 0000      Component Value Date/Time   CALCIUM  9.6 12/15/2023 1107   ALKPHOS 107 12/15/2023 1107   AST 48 (H) 12/15/2023 1107   ALT 41 12/15/2023 1107   BILITOT 0.5 12/15/2023 1107     No diagnosis found.   Impression and Plan: Jeff Aguirre is a very pleasant 63 yo caucasian gentleman with history of hemochromatosis, heterozygous for the H63D mutation and a year long history of erythrocytosis, JAK 2 negative but was noted to have a couple other variant of potential significance. He does smoke.   Today his Hgb is down to 16.7, HCT is 49.2%.  His last iron studied on 11/26/2023 showed a ferritin of 165 and a iron saturation of 12% He may have some baseline false elevation of ferritin- we will monitor.  Iron studies are pending.  CMP pending.  Last TSH on 11/11/2023 was 1.070 Pending results from his abdominal US  Eye exam is scheduled.   Phlebotomy today RTC 2 weeks APP, labs(CBC, CMP, iron, ferritin, TSH), phlebotomy   Lauraine CHRISTELLA Dais, PA-C 7/22/202512:09 PM

## 2023-12-15 NOTE — Progress Notes (Signed)
 Jeff Aguirre presents today for phlebotomy per MD orders. Phlebotomy procedure started at 1215 with 16 g phlebotomy kit and ended at 1230. 535 grams removed. Refreshments given.  Patient did not want to wait for 30 min observation.   Patient tolerated procedure well. IV needle removed intact.

## 2023-12-15 NOTE — Patient Instructions (Signed)

## 2023-12-16 ENCOUNTER — Ambulatory Visit: Payer: Self-pay | Admitting: Medical Oncology

## 2023-12-21 ENCOUNTER — Ambulatory Visit: Payer: Self-pay | Admitting: Medical Oncology

## 2023-12-31 ENCOUNTER — Inpatient Hospital Stay: Attending: Hematology & Oncology

## 2023-12-31 ENCOUNTER — Inpatient Hospital Stay: Admitting: Medical Oncology

## 2023-12-31 ENCOUNTER — Ambulatory Visit: Payer: Self-pay | Admitting: Medical Oncology

## 2023-12-31 ENCOUNTER — Inpatient Hospital Stay

## 2023-12-31 ENCOUNTER — Encounter: Payer: Self-pay | Admitting: Medical Oncology

## 2023-12-31 ENCOUNTER — Encounter: Payer: Self-pay | Admitting: *Deleted

## 2023-12-31 DIAGNOSIS — D751 Secondary polycythemia: Secondary | ICD-10-CM

## 2023-12-31 DIAGNOSIS — D500 Iron deficiency anemia secondary to blood loss (chronic): Secondary | ICD-10-CM

## 2023-12-31 DIAGNOSIS — F17210 Nicotine dependence, cigarettes, uncomplicated: Secondary | ICD-10-CM | POA: Insufficient documentation

## 2023-12-31 DIAGNOSIS — Z1589 Genetic susceptibility to other disease: Secondary | ICD-10-CM

## 2023-12-31 LAB — CBC
HCT: 47 % (ref 39.0–52.0)
Hemoglobin: 16.1 g/dL (ref 13.0–17.0)
MCH: 32 pg (ref 26.0–34.0)
MCHC: 34.3 g/dL (ref 30.0–36.0)
MCV: 93.4 fL (ref 80.0–100.0)
Platelets: 236 K/uL (ref 150–400)
RBC: 5.03 MIL/uL (ref 4.22–5.81)
RDW: 11.7 % (ref 11.5–15.5)
WBC: 5.8 K/uL (ref 4.0–10.5)
nRBC: 0 % (ref 0.0–0.2)

## 2023-12-31 LAB — IRON AND IRON BINDING CAPACITY (CC-WL,HP ONLY)
Iron: 32 ug/dL — ABNORMAL LOW (ref 45–182)
Saturation Ratios: 6 % — ABNORMAL LOW (ref 17.9–39.5)
TIBC: 554 ug/dL — ABNORMAL HIGH (ref 250–450)
UIBC: 522 ug/dL

## 2023-12-31 LAB — CMP (CANCER CENTER ONLY)
ALT: 33 U/L (ref 0–44)
AST: 43 U/L — ABNORMAL HIGH (ref 15–41)
Albumin: 4.4 g/dL (ref 3.5–5.0)
Alkaline Phosphatase: 126 U/L (ref 38–126)
Anion gap: 11 (ref 5–15)
BUN: <5 mg/dL — ABNORMAL LOW (ref 8–23)
CO2: 24 mmol/L (ref 22–32)
Calcium: 9.6 mg/dL (ref 8.9–10.3)
Chloride: 98 mmol/L (ref 98–111)
Creatinine: 0.75 mg/dL (ref 0.61–1.24)
GFR, Estimated: >60 mL/min (ref >60)
Glucose, Bld: 87 mg/dL (ref 70–99)
Potassium: 4.8 mmol/L (ref 3.5–5.1)
Sodium: 133 mmol/L — ABNORMAL LOW (ref 135–145)
Total Bilirubin: 0.3 mg/dL (ref 0.0–1.2)
Total Protein: 7.3 g/dL (ref 6.5–8.1)

## 2023-12-31 LAB — FERRITIN: Ferritin: 56 ng/mL (ref 24–336)

## 2023-12-31 NOTE — Progress Notes (Signed)
 Hematology and Oncology Follow Up Visit  Jeff Aguirre [id number] [birth year] 63 y.o. 12/31/2023   Principle Diagnosis:  Hemochromatosis, heterozygous for the H63D mutation JAK2 negative but did have tier II variants of potential significance (ASXL1 p.Lys523Ter and  TP53 p.Dzm758Eyz)  Current Therapy:   Phlebotomy to maintain Hct < 45%, iron saturation < 50% and ferritin < 100 EC ASA 81 mg p.o. daily   Interim History:  Jeff Aguirre is here today for follow-up.  Today he states that he is doing well. He has no concerns today.    He also had a colonoscopy in June which showed 4 polyps. He return in 3 years for a repeat colonoscopy.   His last donation/phlebotomy was on 12/15/2023. He tolerates them well.   He has had no issues with nausea or vomiting.  No headaches, rash, abdominal pain.   He has had no change in bowel or bladder habits. He smokes 4 cigarettes a day.   He does take aspirin .  Overall, his performance status is probably ECOG 0.  Wt Readings from Last 3 Encounters:  12/31/23 167 lb (75.8 kg)  12/15/23 165 lb 1.9 oz (74.9 kg)  11/26/23 165 lb 1.9 oz (74.9 kg)    Medications:  Allergies as of 12/31/2023       Reactions   Morphine Itching        Medication List        Accurate as of December 31, 2023 10:11 AM. If you have any questions, ask your nurse or doctor.          Aspir-Low 81 MG tablet Generic drug: aspirin  EC Take 81 mg by mouth daily.   lisinopril  5 MG tablet Commonly known as: ZESTRIL  Take 1 tablet (5 mg total) by mouth daily.   metoprolol  succinate 50 MG 24 hr tablet Commonly known as: TOPROL -XL Take 1 tablet by mouth once daily        Allergies:  Allergies  Allergen Reactions   Morphine Itching    Past Medical History, Surgical history, Social history, and Family History were reviewed and updated.  Review of Systems:   Review of Systems  Constitutional: Negative.   HENT: Negative.    Eyes: Negative.   Respiratory:  Negative.    Cardiovascular: Negative.   Gastrointestinal: Negative.   Genitourinary: Negative.   Musculoskeletal: Negative.   Skin: Negative.   Neurological: Negative.   Endo/Heme/Allergies: Negative.   Psychiatric/Behavioral: Negative.      Physical Exam:  height is 5' 10 (1.778 m) and weight is 167 lb (75.8 kg). His oral temperature is 97.9 F (36.6 C). His blood pressure is 99/58 (abnormal) and his pulse is 86. His respiration is 18 and oxygen saturation is 99%.   Wt Readings from Last 3 Encounters:  12/31/23 167 lb (75.8 kg)  12/15/23 165 lb 1.9 oz (74.9 kg)  11/26/23 165 lb 1.9 oz (74.9 kg)    Physical Exam Vitals reviewed.  HENT:     Head: Normocephalic and atraumatic.  Eyes:     Pupils: Pupils are equal, round, and reactive to light.  Cardiovascular:     Rate and Rhythm: Normal rate and regular rhythm.     Heart sounds: Normal heart sounds.  Pulmonary:     Effort: Pulmonary effort is normal.     Breath sounds: Normal breath sounds.  Abdominal:     General: Bowel sounds are normal.     Palpations: Abdomen is soft.  Musculoskeletal:        General:  No tenderness or deformity. Normal range of motion.     Cervical back: Normal range of motion.  Lymphadenopathy:     Cervical: No cervical adenopathy.  Skin:    General: Skin is warm and dry.     Findings: No erythema or rash.  Neurological:     Mental Status: He is alert and oriented to person, place, and time.  Psychiatric:        Behavior: Behavior normal.        Thought Content: Thought content normal.        Judgment: Judgment normal.     Lab Results  Component Value Date   WBC 5.8 12/31/2023   HGB 16.1 12/31/2023   HCT 47.0 12/31/2023   MCV 93.4 12/31/2023   PLT 236 12/31/2023   Lab Results  Component Value Date   FERRITIN 64 12/15/2023   IRON 54 12/15/2023   TIBC 596 (H) 12/15/2023   UIBC 542 12/15/2023   IRONPCTSAT 9 (L) 12/15/2023   Lab Results  Component Value Date   RETICCTPCT 2.0  12/15/2023   RBC 5.03 12/31/2023   No results found for: KPAFRELGTCHN, LAMBDASER, KAPLAMBRATIO No results found for: IGGSERUM, IGA, IGMSERUM No results found for: STEPHANY CARLOTA BENSON MARKEL EARLA JOANNIE DOC VICK, SPEI   Chemistry      Component Value Date/Time   NA 137 12/15/2023 1107   K 4.7 12/15/2023 1107   CL 101 12/15/2023 1107   CO2 26 12/15/2023 1107   BUN <5 (L) 12/15/2023 1107   CREATININE 0.76 12/15/2023 1107   CREATININE 0.64 (L) 10/25/2020 0000      Component Value Date/Time   CALCIUM  9.6 12/15/2023 1107   ALKPHOS 107 12/15/2023 1107   AST 48 (H) 12/15/2023 1107   ALT 41 12/15/2023 1107   BILITOT 0.5 12/15/2023 1107     Encounter Diagnoses  Name Primary?   Other hemochromatosis Yes   Iron deficiency anemia due to chronic blood loss    Erythrocytosis      Impression and Plan: Jeff Aguirre is a very pleasant 63 yo caucasian gentleman with history of hemochromatosis, heterozygous for the H63D mutation and a year long history of erythrocytosis, JAK 2 negative but was noted to have a couple other variant of potential significance. He does smoke.   Today his Hgb is down to 16.1, HCT is 47%.  Last iron studied on 07/22 showed a ferritin of 64 and an iron saturation of 9%.  Iron studies are pending.  CMP pending.  Last TSH on 11/11/2023 was 1.070 Liver US  suggestive of hepatic steatosis vs underlying hepatocellular disease- suggested GI referral Eye exam is scheduled.   No Phlebotomy today RTC 2 weeks APP, labs(CBC, CMP, iron, ferritin, TSH), phlebotomy   Lauraine CHRISTELLA Dais, PA-C 8/7/202510:11 AM

## 2024-01-13 ENCOUNTER — Other Ambulatory Visit: Payer: Self-pay | Admitting: Medical Oncology

## 2024-01-13 ENCOUNTER — Inpatient Hospital Stay

## 2024-01-13 ENCOUNTER — Encounter: Payer: Self-pay | Admitting: Medical Oncology

## 2024-01-13 ENCOUNTER — Inpatient Hospital Stay (HOSPITAL_BASED_OUTPATIENT_CLINIC_OR_DEPARTMENT_OTHER): Admitting: Medical Oncology

## 2024-01-13 DIAGNOSIS — Z1589 Genetic susceptibility to other disease: Secondary | ICD-10-CM | POA: Diagnosis not present

## 2024-01-13 DIAGNOSIS — F17210 Nicotine dependence, cigarettes, uncomplicated: Secondary | ICD-10-CM | POA: Diagnosis not present

## 2024-01-13 DIAGNOSIS — D500 Iron deficiency anemia secondary to blood loss (chronic): Secondary | ICD-10-CM

## 2024-01-13 DIAGNOSIS — D751 Secondary polycythemia: Secondary | ICD-10-CM | POA: Diagnosis not present

## 2024-01-13 LAB — CMP (CANCER CENTER ONLY)
ALT: 35 U/L (ref 0–44)
AST: 48 U/L — ABNORMAL HIGH (ref 15–41)
Albumin: 4.3 g/dL (ref 3.5–5.0)
Alkaline Phosphatase: 124 U/L (ref 38–126)
Anion gap: 11 (ref 5–15)
BUN: <5 mg/dL — ABNORMAL LOW (ref 8–23)
CO2: 24 mmol/L (ref 22–32)
Calcium: 9.1 mg/dL (ref 8.9–10.3)
Chloride: 101 mmol/L (ref 98–111)
Creatinine: 0.74 mg/dL (ref 0.61–1.24)
GFR, Estimated: >60 mL/min (ref >60)
Glucose, Bld: 90 mg/dL (ref 70–99)
Potassium: 4.3 mmol/L (ref 3.5–5.1)
Sodium: 136 mmol/L (ref 135–145)
Total Bilirubin: 0.3 mg/dL (ref 0.0–1.2)
Total Protein: 6.9 g/dL (ref 6.5–8.1)

## 2024-01-13 LAB — IRON AND IRON BINDING CAPACITY (CC-WL,HP ONLY)
Iron: 26 ug/dL — ABNORMAL LOW (ref 45–182)
Saturation Ratios: 5 % — ABNORMAL LOW (ref 17.9–39.5)
TIBC: 540 ug/dL — ABNORMAL HIGH (ref 250–450)
UIBC: 514 ug/dL

## 2024-01-13 LAB — CBC
HCT: 47.5 % (ref 39.0–52.0)
Hemoglobin: 16.1 g/dL (ref 13.0–17.0)
MCH: 31.4 pg (ref 26.0–34.0)
MCHC: 33.9 g/dL (ref 30.0–36.0)
MCV: 92.6 fL (ref 80.0–100.0)
Platelets: 213 K/uL (ref 150–400)
RBC: 5.13 MIL/uL (ref 4.22–5.81)
RDW: 11.9 % (ref 11.5–15.5)
WBC: 5.1 K/uL (ref 4.0–10.5)
nRBC: 0 % (ref 0.0–0.2)

## 2024-01-13 LAB — FERRITIN: Ferritin: 40 ng/mL (ref 24–336)

## 2024-01-13 NOTE — Patient Instructions (Signed)

## 2024-01-13 NOTE — Progress Notes (Signed)
 Jeff Aguirre presents today for phlebotomy per MD orders. Phlebotomy procedure started at 1035 and ended at 1042. 560 grams removed. Patient observed for 10 minutes. Opted not to stay for the 30 minute post phlebotomy observation. Patient tolerated procedure well. VSS IV needle removed intact.

## 2024-01-13 NOTE — Progress Notes (Signed)
 Hematology and Oncology Follow Up Visit  Jeff Aguirre [id number] 10-26-1960 63 y.o. 01/13/2024   Principle Diagnosis:  Hemochromatosis, heterozygous for the H63D mutation JAK2 negative but did have tier II variants of potential significance (ASXL1 p.Lys523Ter and  TP53 p.Dzm758Eyz)  Current Therapy:   Phlebotomy to maintain Hct < 45%, iron saturation < 50% and ferritin < 100 EC ASA 81 mg p.o. daily   Interim History:  Jeff Aguirre is here today for follow-up.  Jeff Aguirre reports that Jeff Aguirre has been well. Jeff Aguirre just bought a treadmill to try to get more active.   Jeff Aguirre also had a colonoscopy in June which showed 4 polyps. Jeff Aguirre return in 3 years for a repeat colonoscopy.   His last donation/phlebotomy was on 12/15/2023. Jeff Aguirre tolerates them well.   Jeff Aguirre has had no issues with nausea or vomiting.  No headaches, rash, abdominal pain.   Jeff Aguirre has had no change in bowel or bladder habits. Jeff Aguirre smokes 4 cigarettes a day.   Jeff Aguirre does take aspirin .  Overall, his performance status is probably ECOG 0.  Wt Readings from Last 3 Encounters:  01/13/24 166 lb (75.3 kg)  12/31/23 167 lb (75.8 kg)  12/15/23 165 lb 1.9 oz (74.9 kg)    Medications:  Allergies as of 01/13/2024       Reactions   Morphine Itching        Medication List        Accurate as of January 13, 2024 10:17 AM. If you have any questions, ask your nurse or doctor.          Aspir-Low 81 MG tablet Generic drug: aspirin  EC Take 81 mg by mouth daily.   lisinopril  5 MG tablet Commonly known as: ZESTRIL  Take 1 tablet (5 mg total) by mouth daily.   metoprolol  succinate 50 MG 24 hr tablet Commonly known as: TOPROL -XL Take 1 tablet by mouth once daily        Allergies:  Allergies  Allergen Reactions   Morphine Itching    Past Medical History, Surgical history, Social history, and Family History were reviewed and updated.  Review of Systems:   Review of Systems  Constitutional: Negative.   HENT: Negative.    Eyes: Negative.    Respiratory: Negative.    Cardiovascular: Negative.   Gastrointestinal: Negative.   Genitourinary: Negative.   Musculoskeletal: Negative.   Skin: Negative.   Neurological: Negative.   Endo/Heme/Allergies: Negative.   Psychiatric/Behavioral: Negative.      Physical Exam:  weight is 166 lb (75.3 kg). His oral temperature is 98 F (36.7 C). His blood pressure is 126/73 and his pulse is 82. His respiration is 18 and oxygen saturation is 98%.   Wt Readings from Last 3 Encounters:  01/13/24 166 lb (75.3 kg)  12/31/23 167 lb (75.8 kg)  12/15/23 165 lb 1.9 oz (74.9 kg)    Physical Exam Vitals reviewed.  HENT:     Head: Normocephalic and atraumatic.  Eyes:     Pupils: Pupils are equal, round, and reactive to light.  Cardiovascular:     Rate and Rhythm: Normal rate and regular rhythm.     Heart sounds: Normal heart sounds.  Pulmonary:     Effort: Pulmonary effort is normal.     Breath sounds: Normal breath sounds.  Abdominal:     General: Bowel sounds are normal.     Palpations: Abdomen is soft.  Musculoskeletal:        General: No tenderness or deformity. Normal range of motion.  Cervical back: Normal range of motion.  Lymphadenopathy:     Cervical: No cervical adenopathy.  Skin:    General: Skin is warm and dry.     Findings: No erythema or rash.  Neurological:     Mental Status: Jeff Aguirre is alert and oriented to person, place, and time.  Psychiatric:        Behavior: Behavior normal.        Thought Content: Thought content normal.        Judgment: Judgment normal.     Lab Results  Component Value Date   WBC 5.1 01/13/2024   HGB 16.1 01/13/2024   HCT 47.5 01/13/2024   MCV 92.6 01/13/2024   PLT 213 01/13/2024   Lab Results  Component Value Date   FERRITIN 56 12/31/2023   IRON 32 (L) 12/31/2023   TIBC 554 (H) 12/31/2023   UIBC 522 12/31/2023   IRONPCTSAT 6 (L) 12/31/2023   Lab Results  Component Value Date   RETICCTPCT 2.0 12/15/2023   RBC 5.13 01/13/2024    No results found for: KPAFRELGTCHN, LAMBDASER, KAPLAMBRATIO No results found for: IGGSERUM, IGA, IGMSERUM No results found for: STEPHANY CARLOTA BENSON MARKEL EARLA JOANNIE DOC VICK, SPEI   Chemistry      Component Value Date/Time   NA 136 01/13/2024 0926   K 4.3 01/13/2024 0926   CL 101 01/13/2024 0926   CO2 24 01/13/2024 0926   BUN <5 (L) 01/13/2024 0926   CREATININE 0.74 01/13/2024 0926   CREATININE 0.64 (L) 10/25/2020 0000      Component Value Date/Time   CALCIUM  9.1 01/13/2024 0926   ALKPHOS 124 01/13/2024 0926   AST 48 (H) 01/13/2024 0926   ALT 35 01/13/2024 0926   BILITOT 0.3 01/13/2024 0926     Encounter Diagnoses  Name Primary?   Other hemochromatosis Yes   Iron deficiency anemia due to chronic blood loss    Erythrocytosis    JAK-2 gene mutation    Iron overload     Impression and Plan: Jeff Aguirre is a very pleasant 63 yo caucasian gentleman with history of hemochromatosis, heterozygous for the H63D mutation and a year long history of erythrocytosis, JAK 2 negative but was noted to have a couple other variant of potential significance. Jeff Aguirre does smoke.   Today his Hgb is stable at 16.1, HCT is 47.5%.  Last iron studied on 07/22 showed a ferritin of 56 and an iron saturation of 6%.  Iron studies are pending.  CMP pending.  Last TSH on 11/11/2023 was 1.070 Liver US  suggestive of hepatic steatosis vs underlying hepatocellular disease- suggested GI referral- Jeff Aguirre states that Jeff Aguirre will call them today to make a visit.  Eye exam will be scheduled very soon.   Phlebotomy today RTC 4 weeks APP, labs(CBC, CMP, iron, ferritin, TSH), phlebotomy   Lauraine CHRISTELLA Dais, PA-C 8/20/202510:17 AM

## 2024-01-18 ENCOUNTER — Ambulatory Visit: Payer: Self-pay | Admitting: Medical Oncology

## 2024-02-10 ENCOUNTER — Inpatient Hospital Stay: Admitting: Medical Oncology

## 2024-02-10 ENCOUNTER — Inpatient Hospital Stay

## 2024-02-16 ENCOUNTER — Other Ambulatory Visit: Payer: Self-pay | Admitting: Medical Oncology

## 2024-02-16 DIAGNOSIS — D500 Iron deficiency anemia secondary to blood loss (chronic): Secondary | ICD-10-CM

## 2024-02-16 DIAGNOSIS — D751 Secondary polycythemia: Secondary | ICD-10-CM

## 2024-02-16 DIAGNOSIS — Z1589 Genetic susceptibility to other disease: Secondary | ICD-10-CM

## 2024-02-17 ENCOUNTER — Ambulatory Visit: Payer: Self-pay | Admitting: *Deleted

## 2024-02-17 ENCOUNTER — Inpatient Hospital Stay

## 2024-02-17 ENCOUNTER — Inpatient Hospital Stay (HOSPITAL_BASED_OUTPATIENT_CLINIC_OR_DEPARTMENT_OTHER): Admitting: Family

## 2024-02-17 ENCOUNTER — Encounter: Payer: Self-pay | Admitting: Family

## 2024-02-17 ENCOUNTER — Inpatient Hospital Stay: Attending: Hematology & Oncology

## 2024-02-17 DIAGNOSIS — D500 Iron deficiency anemia secondary to blood loss (chronic): Secondary | ICD-10-CM

## 2024-02-17 DIAGNOSIS — Z1589 Genetic susceptibility to other disease: Secondary | ICD-10-CM

## 2024-02-17 DIAGNOSIS — D751 Secondary polycythemia: Secondary | ICD-10-CM

## 2024-02-17 DIAGNOSIS — Z7982 Long term (current) use of aspirin: Secondary | ICD-10-CM | POA: Insufficient documentation

## 2024-02-17 DIAGNOSIS — F17200 Nicotine dependence, unspecified, uncomplicated: Secondary | ICD-10-CM | POA: Insufficient documentation

## 2024-02-17 LAB — CMP (CANCER CENTER ONLY)
ALT: 42 U/L (ref 0–44)
AST: 48 U/L — ABNORMAL HIGH (ref 15–41)
Albumin: 4.2 g/dL (ref 3.5–5.0)
Alkaline Phosphatase: 111 U/L (ref 38–126)
Anion gap: 11 (ref 5–15)
BUN: <5 mg/dL — ABNORMAL LOW (ref 8–23)
CO2: 25 mmol/L (ref 22–32)
Calcium: 9.7 mg/dL (ref 8.9–10.3)
Chloride: 99 mmol/L (ref 98–111)
Creatinine: 0.7 mg/dL (ref 0.61–1.24)
GFR, Estimated: >60 mL/min (ref >60)
Glucose, Bld: 89 mg/dL (ref 70–99)
Potassium: 4.4 mmol/L (ref 3.5–5.1)
Sodium: 135 mmol/L (ref 135–145)
Total Bilirubin: 0.3 mg/dL (ref 0.0–1.2)
Total Protein: 7 g/dL (ref 6.5–8.1)

## 2024-02-17 LAB — CBC WITH DIFFERENTIAL (CANCER CENTER ONLY)
Abs Immature Granulocytes: 0.02 K/uL (ref 0.00–0.07)
Basophils Absolute: 0.1 K/uL (ref 0.0–0.1)
Basophils Relative: 1 %
Eosinophils Absolute: 0.3 K/uL (ref 0.0–0.5)
Eosinophils Relative: 4 %
HCT: 45.5 % (ref 39.0–52.0)
Hemoglobin: 15.3 g/dL (ref 13.0–17.0)
Immature Granulocytes: 0 %
Lymphocytes Relative: 37 %
Lymphs Abs: 2.2 K/uL (ref 0.7–4.0)
MCH: 29.9 pg (ref 26.0–34.0)
MCHC: 33.6 g/dL (ref 30.0–36.0)
MCV: 88.9 fL (ref 80.0–100.0)
Monocytes Absolute: 0.5 K/uL (ref 0.1–1.0)
Monocytes Relative: 8 %
Neutro Abs: 2.8 K/uL (ref 1.7–7.7)
Neutrophils Relative %: 50 %
Platelet Count: 249 K/uL (ref 150–400)
RBC: 5.12 MIL/uL (ref 4.22–5.81)
RDW: 12.6 % (ref 11.5–15.5)
WBC Count: 5.9 K/uL (ref 4.0–10.5)
nRBC: 0 % (ref 0.0–0.2)

## 2024-02-17 LAB — IRON AND IRON BINDING CAPACITY (CC-WL,HP ONLY)
Iron: 50 ug/dL (ref 45–182)
Saturation Ratios: 9 % — ABNORMAL LOW (ref 17.9–39.5)
TIBC: 532 ug/dL — ABNORMAL HIGH (ref 250–450)
UIBC: 482 ug/dL

## 2024-02-17 LAB — FERRITIN: Ferritin: 38 ng/mL (ref 24–336)

## 2024-02-17 NOTE — Progress Notes (Signed)
 Hematology and Oncology Follow Up Visit  Jeff Aguirre [id number] 11/23/1960 63 y.o. 02/17/2024   Principle Diagnosis:  Hemochromatosis, heterozygous for the H63D mutation JAK2 negative but did have tier II variants of potential significance (ASXL1 p.Lys523Ter and  TP53 p.Dzm758Eyz)   Current Therapy:        Phlebotomy to maintain Hct < 45%, iron saturation < 50% and ferritin < 100 EC ASA 81 mg p.o. daily             Interim History:  Jeff Aguirre is here today for follow-up. He is doing quite well and has no complaints at this time.  He plans to follow-up with his ophthalmologist and let them know about his hemochromatosis diagnosis so they can also monitor.  I will route his note from today and US  from July to his gastroenterologist Dr. Shila so they are also aware.  No abdominal pain or bloating. US  did show diffusely increased hepatic steatosis. No abominable discomfort or organomegaly noted on today's exam.  No fever, chills, n/v, cough, rash, dizziness, SOB, chest pain or changes in bowel or bladder habits.  No blood loss noted. No abnormal bruising, no petechiae.  No swelling, numbness or tingling in his extremities.  No falls or syncope reported.  Appetite and hydration are good. Weight is stable at 170 lbs.   ECOG Performance Status: 0 - Asymptomatic  Medications:  Allergies as of 02/17/2024       Reactions   Morphine Itching        Medication List        Accurate as of February 17, 2024 10:13 AM. If you have any questions, ask your nurse or doctor.          Aspir-Low 81 MG tablet Generic drug: aspirin  EC Take 81 mg by mouth daily.   lisinopril  5 MG tablet Commonly known as: ZESTRIL  Take 1 tablet (5 mg total) by mouth daily.   metoprolol  succinate 50 MG 24 hr tablet Commonly known as: TOPROL -XL Take 1 tablet by mouth once daily        Allergies:  Allergies  Allergen Reactions   Morphine Itching    Past Medical History, Surgical history,  Social history, and Family History were reviewed and updated.  Review of Systems: All other 10 point review of systems is negative.   Physical Exam:  vitals were not taken for this visit.   Wt Readings from Last 3 Encounters:  01/13/24 166 lb (75.3 kg)  12/31/23 167 lb (75.8 kg)  12/15/23 165 lb 1.9 oz (74.9 kg)    Ocular: Sclerae unicteric, pupils equal, round and reactive to light Ear-nose-throat: Oropharynx clear, dentition fair Lymphatic: No cervical or supraclavicular adenopathy Lungs no rales or rhonchi, good excursion bilaterally Heart regular rate and rhythm, no murmur appreciated Abd soft, nontender, positive bowel sounds MSK no focal spinal tenderness, no joint edema Neuro: non-focal, well-oriented, appropriate affect Breasts: Deferred   Lab Results  Component Value Date   WBC 5.1 01/13/2024   HGB 16.1 01/13/2024   HCT 47.5 01/13/2024   MCV 92.6 01/13/2024   PLT 213 01/13/2024   Lab Results  Component Value Date   FERRITIN 40 01/13/2024   IRON 26 (L) 01/13/2024   TIBC 540 (H) 01/13/2024   UIBC 514 01/13/2024   IRONPCTSAT 5 (L) 01/13/2024   Lab Results  Component Value Date   RETICCTPCT 2.0 12/15/2023   RBC 5.13 01/13/2024   No results found for: KPAFRELGTCHN, LAMBDASER, KAPLAMBRATIO No results found for: IGGSERUM,  IGA, IGMSERUM No results found for: STEPHANY CARLOTA BENSON MARKEL EARLA JOANNIE DOC VICK, SPEI   Chemistry      Component Value Date/Time   NA 136 01/13/2024 0926   K 4.3 01/13/2024 0926   CL 101 01/13/2024 0926   CO2 24 01/13/2024 0926   BUN <5 (L) 01/13/2024 0926   CREATININE 0.74 01/13/2024 0926   CREATININE 0.64 (L) 10/25/2020 0000      Component Value Date/Time   CALCIUM  9.1 01/13/2024 0926   ALKPHOS 124 01/13/2024 0926   AST 48 (H) 01/13/2024 0926   ALT 35 01/13/2024 0926   BILITOT 0.3 01/13/2024 0926       Impression and Plan: Jeff Aguirre is a very pleasant 63 yo caucasian  gentleman with history of hemochromatosis, heterozygous for the H63D mutation and a year long history of erythrocytosis, JAK 2 negative but was noted to have a couple other variant of potential significance. Of note, he does smoke.  Iron studies are pending. We will set up for phlebotomy again if needed.  Iron saturation over the last 3 months has been  < 20% and ferritin less than 100 over the last 2 months.  We will plan to see him for follow-up in 8 weeks.  Patient agrees to follow-up with his ophthalmologist regarding hemochromatosis as well.   Lauraine Pepper, NP 9/24/202510:13 AM

## 2024-02-27 ENCOUNTER — Other Ambulatory Visit: Payer: Self-pay | Admitting: Family Medicine

## 2024-02-27 DIAGNOSIS — I10 Essential (primary) hypertension: Secondary | ICD-10-CM

## 2024-02-29 ENCOUNTER — Encounter: Payer: Self-pay | Admitting: Family Medicine

## 2024-02-29 ENCOUNTER — Ambulatory Visit: Payer: Self-pay | Admitting: Family Medicine

## 2024-02-29 ENCOUNTER — Ambulatory Visit: Admitting: Family Medicine

## 2024-02-29 VITALS — BP 119/77 | HR 96 | Ht 70.0 in | Wt 167.0 lb

## 2024-02-29 DIAGNOSIS — R972 Elevated prostate specific antigen [PSA]: Secondary | ICD-10-CM

## 2024-02-29 DIAGNOSIS — Z23 Encounter for immunization: Secondary | ICD-10-CM

## 2024-02-29 DIAGNOSIS — Z125 Encounter for screening for malignant neoplasm of prostate: Secondary | ICD-10-CM

## 2024-02-29 DIAGNOSIS — Z0000 Encounter for general adult medical examination without abnormal findings: Secondary | ICD-10-CM

## 2024-02-29 LAB — LIPID PANEL
Cholesterol: 120 mg/dL (ref 0–200)
HDL: 47.1 mg/dL (ref >39.00)
LDL Cholesterol: 39 mg/dL (ref 0–99)
NonHDL: 72.63
Total CHOL/HDL Ratio: 3
Triglycerides: 169 mg/dL — ABNORMAL HIGH (ref 0.0–149.0)
VLDL: 33.8 mg/dL (ref 0.0–40.0)

## 2024-02-29 LAB — PSA: PSA: 7.83 ng/mL — ABNORMAL HIGH (ref 0.10–4.00)

## 2024-02-29 NOTE — Addendum Note (Signed)
 Addended by: Zarai Orsborn L on: 02/29/2024 09:11 AM   Modules accepted: Orders

## 2024-02-29 NOTE — Progress Notes (Signed)
 Complete physical exam  Patient: Jeff Aguirre   DOB: 1961/01/06   63 y.o. Male  MRN: [id number]  Subjective:    Chief Complaint  Patient presents with   Annual Exam    Jeff Aguirre is a 63 y.o. male who presents today for a complete physical exam. He reports consuming a general diet. The patient does not participate in regular exercise at present. He generally feels well. He reports sleeping well. He does not have additional problems to discuss today.   Currently lives with: alone Acute concerns or interim problems since last visit: no  Vision concerns: no Dental concerns: no  ETOH use: occasional Nicotine use: 4 cigs/day Recreational drugs/illegal substances: no   PSA: - The natural history of prostate cancer and ongoing controversy regarding screening and potential treatment outcomes of prostate cancer has been discussed with the patient. The meaning of a false positive PSA and a false negative PSA has been discussed. He indicates understanding of the limitations of this screening test and wishes to proceed with screening PSA testing.       Most recent fall risk assessment:    02/29/2024    8:59 AM  Fall Risk   Falls in the past year? 0  Number falls in past yr: 0  Injury with Fall? 0  Risk for fall due to : No Fall Risks  Follow up Falls evaluation completed     Most recent depression screenings:    02/29/2024    9:00 AM 02/17/2024   10:56 AM  PHQ 2/9 Scores  PHQ - 2 Score 0 0  PHQ- 9 Score 0             Patient Care Team: Almarie Waddell NOVAK, NP as PCP - General (Family Medicine)   Outpatient Medications Prior to Visit  Medication Sig   aspirin  EC (ASPIR-LOW) 81 MG tablet Take 81 mg by mouth daily.   lisinopril  (ZESTRIL ) 5 MG tablet Take 1 tablet (5 mg total) by mouth daily.   metoprolol  succinate (TOPROL -XL) 50 MG 24 hr tablet Take 1 tablet by mouth once daily   No facility-administered medications prior to visit.    ROS All review of  systems negative except what is listed in the HPI        Objective:     BP 119/77   Pulse 96   Ht 5' 10 (1.778 m)   Wt 167 lb (75.8 kg)   SpO2 96%   BMI 23.96 kg/m    Physical Exam Vitals reviewed.  Constitutional:      General: He is not in acute distress.    Appearance: Normal appearance. He is not ill-appearing.  HENT:     Head: Normocephalic and atraumatic.     Right Ear: Tympanic membrane normal.     Left Ear: Tympanic membrane normal.     Nose: Nose normal.     Mouth/Throat:     Mouth: Mucous membranes are moist.     Pharynx: Oropharynx is clear.  Eyes:     Extraocular Movements: Extraocular movements intact.     Conjunctiva/sclera: Conjunctivae normal.     Pupils: Pupils are equal, round, and reactive to light.  Cardiovascular:     Rate and Rhythm: Normal rate and regular rhythm.     Pulses: Normal pulses.     Heart sounds: Normal heart sounds.  Pulmonary:     Effort: Pulmonary effort is normal.     Breath sounds: Normal breath sounds.  Abdominal:  General: Abdomen is flat. Bowel sounds are normal. There is no distension.     Palpations: Abdomen is soft. There is no mass.     Tenderness: There is no abdominal tenderness. There is no right CVA tenderness, left CVA tenderness, guarding or rebound.  Genitourinary:    Comments: Deferred exam Musculoskeletal:        General: Normal range of motion.     Cervical back: Normal range of motion and neck supple. No tenderness.     Right lower leg: No edema.     Left lower leg: No edema.  Lymphadenopathy:     Cervical: No cervical adenopathy.  Skin:    General: Skin is warm and dry.     Capillary Refill: Capillary refill takes less than 2 seconds.  Neurological:     General: No focal deficit present.     Mental Status: He is alert and oriented to person, place, and time. Mental status is at baseline.  Psychiatric:        Mood and Affect: Mood normal.        Behavior: Behavior normal.        Thought  Content: Thought content normal.        Judgment: Judgment normal.      No results found for any visits on 02/29/24.     Assessment & Plan:    Routine Health Maintenance and Physical Exam Discussed health promotion and safety including diet and exercise recommendations, dental health, and injury prevention. Tobacco cessation if applicable. Seat belts, sunscreen, smoke detectors, etc.    Immunization History  Administered Date(s) Administered   Influenza,inj,Quad PF,6+ Mos 07/06/2017, 02/10/2018   Polio, Unspecified 01/01/1960   Tdap 10/15/2011    Health Maintenance  Topic Date Due   Medicare Annual Wellness (AWV)  Never done   Pneumococcal Vaccine: 50+ Years (1 of 2 - PCV) Never done   Zoster Vaccines- Shingrix (1 of 2) Never done   Influenza Vaccine  12/25/2023   DTaP/Tdap/Td (2 - Td or Tdap) 07/28/2024 (Originally 10/14/2021)   COVID-19 Vaccine (1) 07/28/2024 (Originally 08/03/1965)   Colonoscopy  11/22/2024   Hepatitis C Screening  Completed   HIV Screening  Completed   Hepatitis B Vaccines 19-59 Average Risk  Aged Out   Meningococcal B Vaccine  Aged Out        Problem List Items Addressed This Visit   None Visit Diagnoses       Annual physical exam    -  Primary   Relevant Orders   Lipid panel   PSA     Screening for prostate cancer       Relevant Orders   PSA          PATIENT COUNSELING:    Encouraged smoking cessation.   Recommend that most people either abstain from alcohol or drink within safe limits (<=14/week and <=4 drinks/occasion for males, <=7/weeks and <= 3 drinks/occasion for females) and that the risk for alcohol disorders and other health effects rises proportionally with the number of drinks per week and how often a drinker exceeds daily limits.   Diet: Recommend to adjust caloric intake to maintain or achieve ideal body weight, to reduce intake of dietary saturated fat and total fat, to limit sodium intake by avoiding high sodium foods  and not adding table salt, and to maintain adequate dietary potassium and calcium  preferably from fresh fruits, vegetables, and low-fat dairy products.   Emphasized the importance of regular exercise.  Injury prevention: Recommend  seatbelts, safety helmets, smoke detector, etc..   Dental health: Recommend regular tooth brushing, flossing, and dental visits.       Return in about 6 months (around 08/29/2024) for chronic disease management; schedule AWV.     Waddell KATHEE Mon, NP

## 2024-03-31 ENCOUNTER — Other Ambulatory Visit: Payer: Self-pay | Admitting: Neurology

## 2024-03-31 DIAGNOSIS — I10 Essential (primary) hypertension: Secondary | ICD-10-CM

## 2024-03-31 MED ORDER — LISINOPRIL 5 MG PO TABS: 5.0000 mg | ORAL_TABLET | Freq: Every day | ORAL | 1 refills | Status: AC

## 2024-04-06 ENCOUNTER — Ambulatory Visit: Payer: Self-pay | Admitting: Family Medicine

## 2024-04-06 ENCOUNTER — Ambulatory Visit: Payer: Self-pay | Admitting: Gastroenterology

## 2024-04-06 ENCOUNTER — Other Ambulatory Visit

## 2024-04-06 DIAGNOSIS — R972 Elevated prostate specific antigen [PSA]: Secondary | ICD-10-CM

## 2024-04-06 LAB — PSA: PSA: 7.19 ng/mL — ABNORMAL HIGH (ref 0.10–4.00)

## 2024-04-13 ENCOUNTER — Inpatient Hospital Stay

## 2024-04-13 ENCOUNTER — Inpatient Hospital Stay: Attending: Hematology & Oncology

## 2024-04-13 ENCOUNTER — Inpatient Hospital Stay (HOSPITAL_BASED_OUTPATIENT_CLINIC_OR_DEPARTMENT_OTHER): Admitting: Family

## 2024-04-13 DIAGNOSIS — D500 Iron deficiency anemia secondary to blood loss (chronic): Secondary | ICD-10-CM

## 2024-04-13 DIAGNOSIS — Z7982 Long term (current) use of aspirin: Secondary | ICD-10-CM | POA: Insufficient documentation

## 2024-04-13 DIAGNOSIS — D751 Secondary polycythemia: Secondary | ICD-10-CM

## 2024-04-13 LAB — CBC WITH DIFFERENTIAL (CANCER CENTER ONLY)
Abs Immature Granulocytes: 0.02 K/uL (ref 0.00–0.07)
Basophils Absolute: 0.1 K/uL (ref 0.0–0.1)
Basophils Relative: 1 %
Eosinophils Absolute: 0.2 K/uL (ref 0.0–0.5)
Eosinophils Relative: 4 %
HCT: 49.5 % (ref 39.0–52.0)
Hemoglobin: 16.8 g/dL (ref 13.0–17.0)
Immature Granulocytes: 0 %
Lymphocytes Relative: 33 %
Lymphs Abs: 2 K/uL (ref 0.7–4.0)
MCH: 29.6 pg (ref 26.0–34.0)
MCHC: 33.9 g/dL (ref 30.0–36.0)
MCV: 87.1 fL (ref 80.0–100.0)
Monocytes Absolute: 0.6 K/uL (ref 0.1–1.0)
Monocytes Relative: 10 %
Neutro Abs: 3.1 K/uL (ref 1.7–7.7)
Neutrophils Relative %: 52 %
Platelet Count: 243 K/uL (ref 150–400)
RBC: 5.68 MIL/uL (ref 4.22–5.81)
RDW: 15.2 % (ref 11.5–15.5)
WBC Count: 6 K/uL (ref 4.0–10.5)
nRBC: 0 % (ref 0.0–0.2)

## 2024-04-13 LAB — CMP (CANCER CENTER ONLY)
ALT: 56 U/L — ABNORMAL HIGH (ref 0–44)
AST: 69 U/L — ABNORMAL HIGH (ref 15–41)
Albumin: 4.3 g/dL (ref 3.5–5.0)
Alkaline Phosphatase: 126 U/L (ref 38–126)
Anion gap: 12 (ref 5–15)
BUN: <5 mg/dL — ABNORMAL LOW (ref 8–23)
CO2: 23 mmol/L (ref 22–32)
Calcium: 9.4 mg/dL (ref 8.9–10.3)
Chloride: 100 mmol/L (ref 98–111)
Creatinine: 0.78 mg/dL (ref 0.61–1.24)
GFR, Estimated: >60 mL/min (ref >60)
Glucose, Bld: 98 mg/dL (ref 70–99)
Potassium: 4.3 mmol/L (ref 3.5–5.1)
Sodium: 136 mmol/L (ref 135–145)
Total Bilirubin: 0.5 mg/dL (ref 0.0–1.2)
Total Protein: 7.4 g/dL (ref 6.5–8.1)

## 2024-04-13 LAB — IRON AND IRON BINDING CAPACITY (CC-WL,HP ONLY)
Iron: 38 ug/dL — ABNORMAL LOW (ref 45–182)
Saturation Ratios: 7 % — ABNORMAL LOW (ref 17.9–39.5)
TIBC: 511 ug/dL — ABNORMAL HIGH (ref 250–450)
UIBC: 473 ug/dL

## 2024-04-13 LAB — FERRITIN: Ferritin: 45 ng/mL (ref 24–336)

## 2024-04-13 MED ORDER — SODIUM CHLORIDE 0.9 % IV SOLN: Freq: Once | INTRAVENOUS | Status: DC

## 2024-04-13 NOTE — Progress Notes (Signed)
 Jeff Aguirre presents today for phlebotomy per MD orders. Phlebotomy procedure started at 0904 and ended at 0910. 545 cc removed via 16 G needle at L Ac site. Patient tolerated procedure well. Patient refused to wait 30 minutes post phlebotomy. Released stable and ASX.

## 2024-04-13 NOTE — Progress Notes (Signed)
 Hematology and Oncology Follow Up Visit  KAMIN NIBLACK [id number] 1961/03/05 63 y.o. 04/13/2024   Principle Diagnosis:  Hemochromatosis, heterozygous for the H63D mutation JAK2 negative but did have tier II variants of potential significance (ASXL1 p.Lys523Ter and  TP53 p.[id number])   Current Therapy:        Phlebotomy to maintain Hct < 45%, iron saturation < 50% and ferritin < 100 EC ASA 81 mg p.o. daily        Interim History:  Mr. Kurtzman is here today for follow-up. He is doing well and has no complaints at this time.  No issue with headaches, blurred or double vision.  No fever, chills, n/v, cough, rash, dizziness, SOB, chest pain, palpitations, abdominal pain/bloating or changes in bowel or bladder habits.  No swelling, tenderness, numbness or tingling in his extremities.  No falls or syncope.  Appetite and hydration are good. Weight is stable at 164 lbs.   ECOG Performance Status: 0 - Asymptomatic  Medications:  Allergies as of 04/13/2024       Reactions   Morphine Itching        Medication List        Accurate as of April 13, 2024  8:47 AM. If you have any questions, ask your nurse or doctor.          Aspir-Low 81 MG tablet Generic drug: aspirin  EC Take 81 mg by mouth daily.   lisinopril  5 MG tablet Commonly known as: ZESTRIL  Take 1 tablet (5 mg total) by mouth daily.   metoprolol  succinate 50 MG 24 hr tablet Commonly known as: TOPROL -XL Take 1 tablet by mouth once daily        Allergies:  Allergies  Allergen Reactions   Morphine Itching    Past Medical History, Surgical history, Social history, and Family History were reviewed and updated.  Review of Systems: All other 10 point review of systems is negative.   Physical Exam:  weight is 164 lb (74.4 kg). His oral temperature is 97.7 F (36.5 C). His blood pressure is 127/79 and his pulse is 93. His respiration is 16 and oxygen saturation is 100%.   Wt Readings from Last 3  Encounters:  04/13/24 164 lb (74.4 kg)  02/29/24 167 lb (75.8 kg)  02/17/24 170 lb (77.1 kg)    Ocular: Sclerae unicteric, pupils equal, round and reactive to light Ear-nose-throat: Oropharynx clear, dentition fair Lymphatic: No cervical or supraclavicular adenopathy Lungs no rales or rhonchi, good excursion bilaterally Heart regular rate and rhythm, no murmur appreciated Abd soft, nontender, positive bowel sounds MSK no focal spinal tenderness, no joint edema Neuro: non-focal, well-oriented, appropriate affect Breasts: Deferred   Lab Results  Component Value Date   WBC 6.0 04/13/2024   HGB 16.8 04/13/2024   HCT 49.5 04/13/2024   MCV 87.1 04/13/2024   PLT 243 04/13/2024   Lab Results  Component Value Date   FERRITIN 38 02/17/2024   IRON 50 02/17/2024   TIBC 532 (H) 02/17/2024   UIBC 482 02/17/2024   IRONPCTSAT 9 (L) 02/17/2024   Lab Results  Component Value Date   RETICCTPCT 2.0 12/15/2023   RBC 5.68 04/13/2024   No results found for: KPAFRELGTCHN, LAMBDASER, KAPLAMBRATIO No results found for: IGGSERUM, IGA, IGMSERUM No results found for: TOTALPROTELP, ALBUMINELP, A1GS, A2GS, BETS, BETA2SER, GAMS, MSPIKE, SPEI   Chemistry      Component Value Date/Time   NA 135 02/17/2024 1010   K 4.4 02/17/2024 1010   CL 99 02/17/2024 1010  CO2 25 02/17/2024 1010   BUN <5 (L) 02/17/2024 1010   CREATININE 0.70 02/17/2024 1010   CREATININE 0.64 (L) 10/25/2020 0000      Component Value Date/Time   CALCIUM  9.7 02/17/2024 1010   ALKPHOS 111 02/17/2024 1010   AST 48 (H) 02/17/2024 1010   ALT 42 02/17/2024 1010   BILITOT 0.3 02/17/2024 1010       Impression and Plan: Mr. Grow is a very pleasant 63 yo caucasian gentleman with history of hemochromatosis, heterozygous for the H63D mutation and a year long history of erythrocytosis, JAK 2 negative but was noted to have a couple other variant of potential significance. Of note, he does smoke.  Iron  studies are pending. So far he has remained well within his set parameters.  We will phlebotomize him today for Hct 49%.  Follow-up in 8 weeks.   Lauraine Pepper, NP 11/19/20258:47 AM

## 2024-04-13 NOTE — Patient Instructions (Signed)

## 2024-04-14 ENCOUNTER — Ambulatory Visit: Payer: Self-pay | Admitting: Family Medicine

## 2024-04-14 DIAGNOSIS — R748 Abnormal levels of other serum enzymes: Secondary | ICD-10-CM

## 2024-04-25 ENCOUNTER — Telehealth

## 2024-04-25 DIAGNOSIS — J069 Acute upper respiratory infection, unspecified: Secondary | ICD-10-CM

## 2024-04-26 MED ORDER — BENZONATATE 100 MG PO CAPS: 100.0000 mg | ORAL_CAPSULE | Freq: Three times a day (TID) | ORAL | 0 refills | Status: DC | PRN

## 2024-04-26 NOTE — Progress Notes (Signed)

## 2024-04-28 ENCOUNTER — Other Ambulatory Visit

## 2024-04-28 ENCOUNTER — Encounter: Payer: Self-pay | Admitting: Family Medicine

## 2024-04-28 DIAGNOSIS — J029 Acute pharyngitis, unspecified: Secondary | ICD-10-CM

## 2024-04-28 MED ORDER — AMOXICILLIN 500 MG PO CAPS: 500.0000 mg | ORAL_CAPSULE | Freq: Two times a day (BID) | ORAL | 0 refills | Status: AC

## 2024-04-28 NOTE — Telephone Encounter (Signed)
 Evisit done in our system from 04/25/2024

## 2024-05-04 ENCOUNTER — Ambulatory Visit: Payer: Self-pay

## 2024-05-04 NOTE — Telephone Encounter (Signed)
 FYI Only or Action Required?: Action required by provider: clinical question for provider and update on patient condition.  Patient was last seen in primary care on 02/29/2024 by Almarie Waddell NOVAK, NP.  Called Nurse Triage reporting Nasal Congestion.  Symptoms began several weeks ago.  Interventions attempted: OTC medications: liquid tylenol cold and flu, Prescription medications: tessalon ; amoxicillin , and Rest, hydration, or home remedies.  Symptoms are: unchanged.  Triage Disposition: See PCP When Office is Open (Within 3 Days)  Patient/caregiver understands and will follow disposition?:   Copied from CRM #[id number]. Topic: Clinical - Red Word Triage >> May 04, 2024  4:46 PM Shereese L wrote: [facility] that prompted transfer to Nurse Triage: discolored mucus comes from mouth and nose. Unable to check temp because he has no themometer    Reason for Disposition  Cough has been present for > 3 weeks  Answer Assessment - Initial Assessment Questions Pt states he has been treated for ongoing productive cough with yellow/green sputum. Initially treated via virtual visit 12/02 but no improvement so messaged PCP via my chart. Pt has taken amoxicillin  as directed without relief. Pt states he has to be in the clinic tomorrow for labs at 8:30 and would like to see PCP to have her listen to his lungs to r/o anything further. Discussed no appts until Monday, but that he is welcome to ask when he checks in as he will be in clinic. Pt voiced appreciation.    1. ONSET: When did the cough begin?      Present since after thanksgiving; states his BIL and sister were dx with strep after thanksgiving with similar symptoms. He was seen via virtual appt and given tessalon  pearls 12/02 without improvement. Contacted Waddell Almarie, NP 12/04 and initiated amoxicillin  and it has not improved symptoms at this time.   2. SEVERITY: How bad is the cough today?      Moderate cough; pt more bothered by sputum   3.  SPUTUM: Describe the color of your sputum (e.g., none, dry cough; clear, white, yellow, green)     Green/yellow   4. HEMOPTYSIS: Are you coughing up any blood? If Yes, ask: How much? (e.g., flecks, streaks, tablespoons, etc.)     None   5. DIFFICULTY BREATHING: Are you having difficulty breathing? If Yes, ask: How bad is it? (e.g., mild, moderate, severe)      No; just normal discomfort/pressure with congestion per pt  6. FEVER: Do you have a fever? If Yes, ask: What is your temperature, how was it measured, and when did it start?     Unknown; states he does not have a thermometer but denies chills or body aches   10. OTHER SYMPTOMS: Do you have any other symptoms? (e.g., runny nose, wheezing, chest pain)       Wheezing at first, runny nose; denies chest pain or SOB  Protocols used: Cough - Acute Productive-A-AH

## 2024-05-05 ENCOUNTER — Ambulatory Visit: Payer: Self-pay | Admitting: Family Medicine

## 2024-05-05 ENCOUNTER — Ambulatory Visit (HOSPITAL_BASED_OUTPATIENT_CLINIC_OR_DEPARTMENT_OTHER)
Admission: RE | Admit: 2024-05-05 | Discharge: 2024-05-05 | Disposition: A | Discharge status: Home / Self Care | Source: Ambulatory Visit | Attending: Family Medicine | Admitting: Family Medicine

## 2024-05-05 ENCOUNTER — Other Ambulatory Visit

## 2024-05-05 ENCOUNTER — Ambulatory Visit (INDEPENDENT_AMBULATORY_CARE_PROVIDER_SITE_OTHER): Admitting: Family Medicine

## 2024-05-05 VITALS — BP 126/85 | HR 118 | Temp 97.6°F | Wt 162.0 lb

## 2024-05-05 DIAGNOSIS — R0602 Shortness of breath: Secondary | ICD-10-CM

## 2024-05-05 DIAGNOSIS — J029 Acute pharyngitis, unspecified: Secondary | ICD-10-CM

## 2024-05-05 DIAGNOSIS — R000 Tachycardia, unspecified: Secondary | ICD-10-CM

## 2024-05-05 DIAGNOSIS — R7989 Other specified abnormal findings of blood chemistry: Secondary | ICD-10-CM | POA: Diagnosis not present

## 2024-05-05 DIAGNOSIS — R748 Abnormal levels of other serum enzymes: Secondary | ICD-10-CM | POA: Diagnosis not present

## 2024-05-05 LAB — COMPREHENSIVE METABOLIC PANEL WITH GFR
ALT: 34 U/L (ref 0–53)
AST: 33 U/L (ref 0–37)
Albumin: 4.2 g/dL (ref 3.5–5.2)
Alkaline Phosphatase: 97 U/L (ref 39–117)
BUN: 9 mg/dL (ref 6–23)
CO2: 27 meq/L (ref 19–32)
Calcium: 9.4 mg/dL (ref 8.4–10.5)
Chloride: 102 meq/L (ref 96–112)
Creatinine, Ser: 0.75 mg/dL (ref 0.40–1.50)
GFR: 94.54 mL/min (ref >60.00)
Glucose, Bld: 137 mg/dL — ABNORMAL HIGH (ref 70–99)
Potassium: 3.5 meq/L (ref 3.5–5.1)
Sodium: 137 meq/L (ref 135–145)
Total Bilirubin: 0.4 mg/dL (ref 0.2–1.2)
Total Protein: 7.1 g/dL (ref 6.0–8.3)

## 2024-05-05 LAB — POCT RAPID STREP A (OFFICE): Rapid Strep A Screen: NEGATIVE

## 2024-05-05 LAB — D-DIMER, QUANTITATIVE: D-Dimer, Quant: 1.44 ug{FEU}/mL — ABNORMAL HIGH (ref <0.50)

## 2024-05-05 NOTE — Telephone Encounter (Signed)
 Pt seen today

## 2024-05-05 NOTE — Patient Instructions (Signed)
 Good to see you today!  Please go to lab and then to the ground floor to have a chest x-ray I will be in touch with your labs and chest film later today If your D dimer is + we will want to do a CT of your chest to rule out a blood clot. If any sign of pneumonia on your chest x-ray we will add a 2nd antibiotic If all is clear I think oral steroids may help!

## 2024-05-05 NOTE — Progress Notes (Signed)
  Healthcare at [facility] [address], [address] [place], [place] [address] [phone]  Date:  05/05/2024   Name:  Jeff Aguirre   DOB:  [birth year]   MRN:  [id number]  PCP:  Almarie Waddell NOVAK, NP    Chief Complaint: No chief complaint on file.   History of Present Illness:  Jeff Aguirre is a 63 y.o. very pleasant male patient who presents with the following:  Pt seen today for a sick visit - history of HTN, hemochromatosis Primary pt of Waddell Almarie- he did a virtual visit 12/4 and Waddell sent in some amoxicillin  for him which he is still taking  Pulse Readings from Last 3 Encounters:  05/05/24 (!) 118  04/13/24 (!) 104  04/13/24 93   He notes NO alcohol or tobacco since 04/15/24- offered congratulations   Discussed the use of AI scribe software for clinical note transcription with the patient, who gave verbal consent to proceed.  History of Present Illness Jeff Aguirre is a 63 year old male who presents with persistent cough and congestion.  He has been experiencing a persistent cough and congestion for over two weeks, beginning on the Saturday after Thanksgiving. The cough is productive of thick, yellowish-brown sputum, and he feels very congested, making it difficult to breathe. He is unsure if he had a fever as he does not own a thermometer, but he felt very warm last night. No vomiting or diarrhea, but he notes constipation.  He spent Thanksgiving with his sister and brother, who also became sick and were diagnosed with strep throat. He has been taking amoxicillin  since December 4th, but reports no improvement in his symptoms.  He quit smoking in November 2020, having previously smoked four cigarettes a day. He takes aspirin , lisinopril , and metoprolol  regularly. He notes his heart rate tends to be a little fast, which is typical for him. He experiences shortness of breath but denies any chest pain. He mentions having slight  body aches and a small headache, but nothing significant.  He has has stopped drinking alcohol over the last few weeks    Patient Active Problem List   Diagnosis Date Noted   Hereditary hemochromatosis 06/16/2022   Elevated PSA, less than 10 ng/ml 02/03/2018   Hyponatremia 04/10/2016   Elevated liver enzymes 04/10/2016   Essential hypertension 02/18/2016   Adenomatous polyps 02/23/2012   Current smoker 10/17/2011    Past Medical History:  Diagnosis Date   Anxiety    Colon polyps    Hypertension     Past Surgical History:  Procedure Laterality Date   COLONOSCOPY     growth on testicle removed  2000's   benign   LUNG SURGERY      Social History[1]  Family History  Problem Relation Age of Onset   Diabetes Mother    Hypertension Mother    Cancer Mother        breast   Diabetes Father    Hypertension Father    Heart disease Father    Diabetes Sister    Hypertension Sister    Diabetes Brother    Hypertension Brother    Colon cancer Neg Hx    Esophageal cancer Neg Hx    Rectal cancer Neg Hx    Stomach cancer Neg Hx     Allergies[2]  Medication list has been reviewed and updated.  Medications Ordered Prior to Encounter[3]  Review of Systems:  As per HPI-  otherwise negative.  Physical Examination: Vitals:   05/05/24 0833  BP: 126/85  Pulse: (!) 118  Temp: 97.6 F (36.4 C)  SpO2: 97%   Vitals:   05/05/24 0833  Weight: 162 lb (73.5 kg)   Body mass index is 23.24 kg/m. Ideal Body Weight:    GEN: no acute distress. Normal weight, looks well  HEENT: Atraumatic, Normocephalic.  Bilateral TM wnl, oropharynx normal.  PEERL,EOMI.   Ears and Nose: No external deformity. CV: RRR, No M/G/R. No JVD. No thrill. No extra heart sounds. PULM: CTA B, no wheezes, crackles, rhonchi. No retractions. No resp. distress. No accessory muscle use. ABD: S, NT, ND. No rebound. No HSM. EXTR: No c/c/e PSYCH: Normally interactive. Conversant.   EKG: sinus tachycardia  rate 107, OW normal  Assessment and Plan: Sore throat - Plan: POCT rapid strep A, CANCELED: Basic metabolic panel with GFR  Tachycardia - Plan: DG Chest 2 View, EKG 12-Lead, D-Dimer, Quantitative  Positive D dimer - Plan: CT Angio Chest W/Cm &/Or Wo Cm  SOB (shortness of breath) - Plan: CT Angio Chest W/Cm &/Or Wo Cm  Assessment & Plan Acute respiratory infection, rule out pneumonia or pulmonary embolism Persistent symptoms with cough, congestion, and shortness of breath. Negative strep test. No improvement with amoxicillin . Differential includes pneumonia and pulmonary embolism. Shortness of breath likely lung-related- no CP. Elevated heart rate raises concern for pulmonary embolism. - Ordered chest x-ray to rule out pneumonia. - Ordered D-dimer test to rule out pulmonary embolism. - If D-dimer positive, will order CT scan of the chest. - Performed EKG to assess cardiac function. If all ok likely may benefit from oral steorids  Hypertension Heart rate slightly elevated, no chest pain.  General Health Maintenance No tobacco use since November 2020. Reduced smoking. No diabetes. Family history of diabetes noted. - Continue current medications: lisinopril  and metoprolol .  Good to see you today!  Please go to lab and then to the ground floor to have a chest x-ray I will be in touch with your labs and chest film later today If your D dimer is + we will want to do a CT of your chest to rule out a blood clot. If any sign of pneumonia on your chest x-ray we will add a 2nd antibiotic If all is clear I think oral steroids may help!  Signed Harlene Schroeder, MD  Received chest x-ray and D dimer  DG Chest 2 View Result Date: 05/05/2024 CLINICAL DATA:  Cough. EXAM: CHEST - 2 VIEW COMPARISON:  None Available. FINDINGS: The heart size and mediastinal contours are within normal limits. No focal consolidation, pleural effusion, or pneumothorax. Minimal scarring in the peripheral left upper lung  zone. Remote right-sided rib fractures. No acute osseous abnormality. IMPRESSION: No acute cardiopulmonary findings. Electronically Signed   By: Harrietta Sherry M.D.   On: 05/05/2024 09:53    Results for orders placed or performed in visit on 05/05/24  POCT rapid strep A   Collection Time: 05/05/24  8:43 AM  Result Value Ref Range   Rapid Strep A Screen Negative Negative  D-Dimer, Quantitative   Collection Time: 05/05/24  9:08 AM  Result Value Ref Range   D-Dimer, Quant 1.44 (H) <0.50 mcg/mL FEU   Called pt- D dimer is +, he needs a CT angiogram,  he is in agreement.   He does not think he can get this done today due to other commitments- he understands it would be best to do right away and that  a PE can be dangerous.  If he gets worse overnight he will go to ER.  Otherwise plan to do CT tomorrow morning - scheduled for 9am     [1]  Social History Tobacco Use   Smoking status: Every Day    Current packs/day: 0.50    Average packs/day: 0.5 packs/day for 35.0 years (17.5 ttl pk-yrs)    Types: Cigarettes   Smokeless tobacco: Former    Types: Chew    Quit date: 05/26/1976   Tobacco comments:    09/16/2023    Smokes 4 cigarettes/day.  Vaping Use   Vaping status: Never Used  Substance Use Topics   Alcohol use: Yes    Alcohol/week: 12.0 - 18.0 standard drinks of alcohol    Types: 12 - 18 Cans of beer per week   Drug use: No  [2]  Allergies Allergen Reactions   Morphine Itching  [3]  Current Outpatient Medications on File Prior to Visit  Medication Sig Dispense Refill   amoxicillin  (AMOXIL ) 500 MG capsule Take 1 capsule (500 mg total) by mouth 2 (two) times daily for 10 days. 20 capsule 0   aspirin  EC (ASPIR-LOW) 81 MG tablet Take 81 mg by mouth daily.     benzonatate  (TESSALON ) 100 MG capsule Take 1 capsule (100 mg total) by mouth 3 (three) times daily as needed for cough. 30 capsule 0   lisinopril  (ZESTRIL ) 5 MG tablet Take 1 tablet (5 mg total) by mouth daily. 90 tablet 1    metoprolol  succinate (TOPROL -XL) 50 MG 24 hr tablet Take 1 tablet by mouth once daily 90 tablet 1   No current facility-administered medications on file prior to visit.

## 2024-05-06 ENCOUNTER — Ambulatory Visit (HOSPITAL_BASED_OUTPATIENT_CLINIC_OR_DEPARTMENT_OTHER)
Admission: RE | Admit: 2024-05-06 | Discharge: 2024-05-06 | Disposition: A | Source: Ambulatory Visit | Attending: Family Medicine | Admitting: Family Medicine

## 2024-05-06 DIAGNOSIS — R7989 Other specified abnormal findings of blood chemistry: Secondary | ICD-10-CM

## 2024-05-06 DIAGNOSIS — R0602 Shortness of breath: Secondary | ICD-10-CM

## 2024-05-06 MED ORDER — PREDNISONE 20 MG PO TABS: ORAL_TABLET | ORAL | 0 refills | Status: DC

## 2024-05-06 MED ORDER — IOHEXOL 350 MG/ML SOLN
75.0000 mL | Freq: Once | INTRAVENOUS | Status: AC | PRN
  Administered 2024-05-06: 75 mL via INTRAVENOUS

## 2024-05-06 NOTE — Addendum Note (Signed)
 Addended by: WATT RAISIN C on: 05/06/2024 12:36 PM   Modules accepted: Orders

## 2024-05-13 ENCOUNTER — Other Ambulatory Visit: Payer: Self-pay | Admitting: Family Medicine

## 2024-05-13 DIAGNOSIS — I10 Essential (primary) hypertension: Secondary | ICD-10-CM

## 2024-05-24 NOTE — Progress Notes (Unsigned)
 "   Chief Complaint: Abnormal PSA test   History of Present Illness: 63 yo male here for E/M of elevated PSA.   Past Medical History:  Past Medical History:  Diagnosis Date   Anxiety    Colon polyps    Hypertension     Past Surgical History:  Past Surgical History:  Procedure Laterality Date   COLONOSCOPY     growth on testicle removed  2000's   benign   LUNG SURGERY      Allergies:  Allergies[1]  Family History:  Family History  Problem Relation Age of Onset   Diabetes Mother    Hypertension Mother    Cancer Mother        breast   Diabetes Father    Hypertension Father    Heart disease Father    Diabetes Sister    Hypertension Sister    Diabetes Brother    Hypertension Brother    Colon cancer Neg Hx    Esophageal cancer Neg Hx    Rectal cancer Neg Hx    Stomach cancer Neg Hx     Social History:  Social History[2]  Review of symptoms:  Constitutional:  Negative for unexplained weight loss, night sweats, fever, chills ENT:  Negative for nose bleeds, sinus pain, painful swallowing CV:  Negative for chest pain, shortness of breath, exercise intolerance, palpitations, loss of consciousness Resp:  Negative for cough, wheezing, shortness of breath GI:  Negative for nausea, vomiting, diarrhea, bloody stools GU:  Positives noted in HPI; otherwise negative for gross hematuria, dysuria, urinary incontinence Neuro:  Negative for seizures, poor balance, limb weakness, slurred speech Psych:  Negative for lack of energy, depression, anxiety Endocrine:  Negative for polydipsia, polyuria, symptoms of hypoglycemia (dizziness, hunger, sweating) Hematologic:  Negative for anemia, purpura, petechia, prolonged or excessive bleeding, use of anticoagulants  Allergic:  Negative for difficulty breathing or choking as a result of exposure to anything; no shellfish allergy; no allergic response (rash/itch) to materials, foods  Physical exam: There were no vitals taken for this  visit. GENERAL APPEARANCE:  Well appearing, well developed, well nourished, NAD HEENT: Atraumatic, Normocephalic. NECK: Normal appearance LUNGS: Normal inspiratory and expiratory excursion HEART: Regular Rate ABDOMEN: ***. GU: Phallus normal, no lesions. Scrotal skin normal. Testicles/epididymal structures normal. Meatus normal. Normal anal sphincter tone, prostate ***mL, symmetric, non nodular, non tender. EXTREMITIES: Moves all extremities well.  Without clubbing, cyanosis, or edema. NEUROLOGIC:  Alert and oriented x 3, normal gait, CN II-XII grossly intact.  MENTAL STATUS:  Appropriate. SKIN:  Warm, dry and intact.    Results: No results found for this or any previous visit (from the past 24 hours).  I have reviewed referring/prior physicians notes  I have reviewed urinalysis  I have reviewed PSA results  I have reviewed prior imaging  I have reviewed urine culture results  Assessment: ***   Plan: ***     [1]  Allergies Allergen Reactions   Morphine Itching  [2]  Social History Tobacco Use   Smoking status: Every Day    Current packs/day: 0.50    Average packs/day: 0.5 packs/day for 35.0 years (17.5 ttl pk-yrs)    Types: Cigarettes   Smokeless tobacco: Former    Types: Chew    Quit date: 05/26/1976   Tobacco comments:    09/16/2023    Smokes 4 cigarettes/day.  Vaping Use   Vaping status: Never Used  Substance Use Topics   Alcohol use: Yes    Alcohol/week: 12.0 - 18.0  standard drinks of alcohol    Types: 12 - 18 Cans of beer per week   Drug use: No   "

## 2024-05-25 ENCOUNTER — Ambulatory Visit: Admitting: Family Medicine

## 2024-05-25 ENCOUNTER — Encounter: Payer: Self-pay | Admitting: Family Medicine

## 2024-05-25 ENCOUNTER — Ambulatory Visit: Admitting: Urology

## 2024-05-25 VITALS — BP 126/76 | HR 111 | Ht 70.0 in | Wt 167.0 lb

## 2024-05-25 VITALS — BP 111/74 | HR 108 | Temp 98.0°F | Ht 70.0 in | Wt 167.0 lb

## 2024-05-25 DIAGNOSIS — J988 Other specified respiratory disorders: Secondary | ICD-10-CM

## 2024-05-25 DIAGNOSIS — M791 Myalgia, unspecified site: Secondary | ICD-10-CM | POA: Diagnosis not present

## 2024-05-25 DIAGNOSIS — R972 Elevated prostate specific antigen [PSA]: Secondary | ICD-10-CM

## 2024-05-25 DIAGNOSIS — R059 Cough, unspecified: Secondary | ICD-10-CM

## 2024-05-25 DIAGNOSIS — R6889 Other general symptoms and signs: Secondary | ICD-10-CM

## 2024-05-25 LAB — URINALYSIS, ROUTINE W REFLEX MICROSCOPIC
Bilirubin, UA: NEGATIVE
Glucose, UA: NEGATIVE
Ketones, UA: NEGATIVE
Leukocytes,UA: NEGATIVE
Nitrite, UA: NEGATIVE
Protein,UA: NEGATIVE
RBC, UA: NEGATIVE
Specific Gravity, UA: 1.01 (ref 1.005–1.030)
Urobilinogen, Ur: 0.2 mg/dL (ref 0.2–1.0)
pH, UA: 5.5 (ref 5.0–7.5)

## 2024-05-25 LAB — POC COVID19 BINAXNOW: SARS Coronavirus 2 Ag: NEGATIVE

## 2024-05-25 LAB — POCT RAPID STREP A (OFFICE): Rapid Strep A Screen: NEGATIVE

## 2024-05-25 LAB — POCT INFLUENZA A/B
Influenza A, POC: NEGATIVE
Influenza B, POC: NEGATIVE

## 2024-05-25 MED ORDER — BENZONATATE 200 MG PO CAPS: 200.0000 mg | ORAL_CAPSULE | Freq: Two times a day (BID) | ORAL | 0 refills | Status: AC | PRN

## 2024-05-25 MED ORDER — PROMETHAZINE-DM 6.25-15 MG/5ML PO SYRP: 5.0000 mL | ORAL_SOLUTION | Freq: Four times a day (QID) | ORAL | 0 refills | Status: AC | PRN

## 2024-05-25 MED ORDER — DOXYCYCLINE HYCLATE 100 MG PO TABS: 100.0000 mg | ORAL_TABLET | Freq: Two times a day (BID) | ORAL | 0 refills | Status: AC

## 2024-05-25 NOTE — Progress Notes (Signed)
 "  Acute Office Visit  Subjective:  Patient ID: Jeff Aguirre, male    DOB: Dec 12, 1960  Age: 63 y.o. MRN: [id number]  CC:  Chief Complaint  Patient presents with   flu like symptoms      HPI Jeff Aguirre is here for flu-like symptoms.    Discussed the use of AI scribe software for clinical note transcription with the patient, who gave verbal consent to proceed.  History of Present Illness Jeff Aguirre is a 63 year old male who presents with recurrence of upper respiratory symptoms.  His symptoms began the Saturday after Thanksgiving with an upper respiratory infection, characterized by a cough and body aches. He was treated with cough medicine, amoxicillin , and prednisone , which provided some relief, and he felt 'halfway normal' by the following Tuesday.  However, his symptoms returned on Sunday afternoon, with a cough and body aches. He does not have a thermometer but does not believe he has had a fever. His cough produces thick, sandy yellowish phlegm, but there is no blood in the sputum. He describes a 'scratchy' sore throat, though not as severe as in the past.  He mentions that his sister and brother-in-law, whom he was with on Thanksgiving and Christmas Day, were diagnosed with strep throat and have not fully recovered yet.  He has been using cough drops and previously used small Tessalon  for cough relief, which he found somewhat effective.  He does not have a thermometer but does not believe he has had a fever, and there is no blood in his sputum. He describes his sore throat as mild and scratchy.       Past Medical History:  Diagnosis Date   Anxiety    Colon polyps    Hypertension     Past Surgical History:  Procedure Laterality Date   COLONOSCOPY     growth on testicle removed  2000's   benign   LUNG SURGERY      Family History  Problem Relation Age of Onset   Diabetes Mother    Hypertension Mother    Cancer Mother        breast   Diabetes  Father    Hypertension Father    Heart disease Father    Diabetes Sister    Hypertension Sister    Diabetes Brother    Hypertension Brother    Colon cancer Neg Hx    Esophageal cancer Neg Hx    Rectal cancer Neg Hx    Stomach cancer Neg Hx     Social History   Socioeconomic History   Marital status: Widowed    Spouse name: Not on file   Number of children: 1   Years of education: 55   Highest education level: 12th grade  Occupational History   Occupation: production designer, theatre/television/film  Tobacco Use   Smoking status: Every Day    Current packs/day: 0.50    Average packs/day: 0.5 packs/day for 35.0 years (17.5 ttl pk-yrs)    Types: Cigarettes   Smokeless tobacco: Former    Types: Chew    Quit date: 05/26/1976   Tobacco comments:    09/16/2023    Smokes 4 cigarettes/day.  Vaping Use   Vaping status: Never Used  Substance and Sexual Activity   Alcohol use: Yes    Alcohol/week: 12.0 - 18.0 standard drinks of alcohol    Types: 12 - 18 Cans of beer per week   Drug use: No   Sexual activity: Yes  Birth control/protection: None  Other Topics Concern   Not on file  Social History Narrative   Lives alone in an apartment on the 2nd floor.  No children.  Works as a production designer, theatre/television/film for [employer].  Education: high school.    Social Drivers of Health   Tobacco Use: High Risk (05/25/2024)   Patient History    Smoking Tobacco Use: Every Day    Smokeless Tobacco Use: Former    Passive Exposure: Not on Actuary Strain: Low Risk (02/22/2024)   Overall Financial Resource Strain (CARDIA)    Difficulty of Paying Living Expenses: Not very hard  Food Insecurity: No Food Insecurity (02/22/2024)   Epic    Worried About Programme Researcher, Broadcasting/film/video in the Last Year: Never true    Ran Out of Food in the Last Year: Never true  Transportation Needs: No Transportation Needs (02/22/2024)   Epic    Lack of Transportation (Medical): No    Lack of Transportation (Non-Medical): No  Physical  Activity: Inactive (02/22/2024)   Exercise Vital Sign    Days of Exercise per Week: 0 days    Minutes of Exercise per Session: Not on file  Stress: No Stress Concern Present (02/22/2024)   [employer] of Occupational Health - Occupational Stress Questionnaire    Feeling of Stress: Not at all  Social Connections: Moderately Isolated (02/22/2024)   Social Connection and Isolation Panel    Frequency of Communication with Friends and Family: Twice a week    Frequency of Social Gatherings with Friends and Family: Once a week    Attends Religious Services: 1 to 4 times per year    Active Member of [employer] or Organizations: No    Attends Banker Meetings: Not on file    Marital Status: Widowed  Intimate Partner Violence: Not on file  Depression [phone]): Low Risk (04/13/2024)   Depression (PHQ2-9)    PHQ-2 Score: 0  Alcohol Screen: Low Risk (02/22/2024)   Alcohol Screen    Last Alcohol Screening Score (AUDIT): 5  Housing: Low Risk (02/22/2024)   Epic    Unable to Pay for Housing in the Last Year: No    Number of Times Moved in the Last Year: 0    Homeless in the Last Year: No  Utilities: Not on file  Health Literacy: Not on file    ROS All ROS negative except what is listed in the HPI.   Objective:   Today's Vitals: BP 111/74   Pulse (!) 108   Temp 98 F (36.7 C) (Oral)   Ht 5' 10 (1.778 m)   Wt 167 lb (75.8 kg)   SpO2 97%   BMI 23.96 kg/m   Physical Exam Vitals reviewed.  Constitutional:      Appearance: Normal appearance.  HENT:     Head: Normocephalic and atraumatic.     Mouth/Throat:     Pharynx: Posterior oropharyngeal erythema present. No oropharyngeal exudate.  Cardiovascular:     Rate and Rhythm: Normal rate and regular rhythm.     Heart sounds: Normal heart sounds.  Pulmonary:     Effort: Pulmonary effort is normal.     Breath sounds: Normal breath sounds. No wheezing, rhonchi or rales.  Musculoskeletal:     Cervical back: Normal range of  motion and neck supple.  Skin:    General: Skin is warm and dry.  Neurological:     Mental Status: He is alert and oriented to person, place,  and time.  Psychiatric:        Mood and Affect: Mood normal.        Behavior: Behavior normal.        Thought Content: Thought content normal.        Judgment: Judgment normal.          Assessment & Plan:   Problem List Items Addressed This Visit   None Visit Diagnoses       Flu-like symptoms    -  Primary   Relevant Orders   POCT Influenza A/B (Completed)   POC COVID-19 BinaxNow (Completed)   POCT rapid strep A (Completed)     Respiratory infection       Relevant Medications   doxycycline (VIBRA-TABS) 100 MG tablet   promethazine-dextromethorphan (PROMETHAZINE-DM) 6.25-15 MG/5ML syrup   benzonatate  (TESSALON ) 200 MG capsule       Assessment & Plan Acute upper respiratory infection Recurrent symptoms with potential atypical bacterial infection. Previous treatment partially effective.  - Swabbed for flu, COVID, and strep today - all negative. - Prescribed doxycycline with food and water, remain upright for 30 minutes post-ingestion. - Provided cough syrup - caution for drowsiness - Instructed on pulmonary hygiene techniques including deep breathing exercises.  Patient aware of signs/symptoms requiring further/urgent evaluation.    Follow-up: Return if symptoms worsen or fail to improve.   Waddell FURY Almarie, DNP, FNP-C  I,Emily Lagle,acting as a neurosurgeon for Waddell KATHEE Almarie, NP.,have documented all relevant documentation on the behalf of Waddell KATHEE Almarie, NP.   I, Waddell KATHEE Almarie, NP, have reviewed all documentation for this visit. The documentation on 05/25/2024 for the exam, diagnosis, procedures, and orders are all accurate and complete. "

## 2024-06-15 ENCOUNTER — Inpatient Hospital Stay: Attending: Hematology & Oncology

## 2024-06-15 ENCOUNTER — Inpatient Hospital Stay

## 2024-06-15 ENCOUNTER — Inpatient Hospital Stay: Admitting: Family

## 2024-06-15 DIAGNOSIS — D751 Secondary polycythemia: Secondary | ICD-10-CM | POA: Diagnosis not present

## 2024-06-15 DIAGNOSIS — D500 Iron deficiency anemia secondary to blood loss (chronic): Secondary | ICD-10-CM

## 2024-06-15 LAB — CMP (CANCER CENTER ONLY)
ALT: 31 U/L (ref 0–44)
AST: 41 U/L (ref 15–41)
Albumin: 4.4 g/dL (ref 3.5–5.0)
Alkaline Phosphatase: 112 U/L (ref 38–126)
Anion gap: 11 (ref 5–15)
BUN: <5 mg/dL — ABNORMAL LOW (ref 8–23)
CO2: 27 mmol/L (ref 22–32)
Calcium: 9.7 mg/dL (ref 8.9–10.3)
Chloride: 101 mmol/L (ref 98–111)
Creatinine: 0.66 mg/dL (ref 0.61–1.24)
GFR, Estimated: >60 mL/min
Glucose, Bld: 99 mg/dL (ref 70–99)
Potassium: 4.7 mmol/L (ref 3.5–5.1)
Sodium: 138 mmol/L (ref 135–145)
Total Bilirubin: 0.6 mg/dL (ref 0.0–1.2)
Total Protein: 7.4 g/dL (ref 6.5–8.1)

## 2024-06-15 LAB — CBC WITH DIFFERENTIAL (CANCER CENTER ONLY)
Abs Immature Granulocytes: 0.01 K/uL (ref 0.00–0.07)
Basophils Absolute: 0.1 K/uL (ref 0.0–0.1)
Basophils Relative: 1 %
Eosinophils Absolute: 0.2 K/uL (ref 0.0–0.5)
Eosinophils Relative: 3 %
HCT: 47.5 % (ref 39.0–52.0)
Hemoglobin: 15.8 g/dL (ref 13.0–17.0)
Immature Granulocytes: 0 %
Lymphocytes Relative: 35 %
Lymphs Abs: 2.2 K/uL (ref 0.7–4.0)
MCH: 29.4 pg (ref 26.0–34.0)
MCHC: 33.3 g/dL (ref 30.0–36.0)
MCV: 88.5 fL (ref 80.0–100.0)
Monocytes Absolute: 0.5 K/uL (ref 0.1–1.0)
Monocytes Relative: 8 %
Neutro Abs: 3.3 K/uL (ref 1.7–7.7)
Neutrophils Relative %: 53 %
Platelet Count: 273 K/uL (ref 150–400)
RBC: 5.37 MIL/uL (ref 4.22–5.81)
RDW: 14.6 % (ref 11.5–15.5)
WBC Count: 6.2 K/uL (ref 4.0–10.5)
nRBC: 0 % (ref 0.0–0.2)

## 2024-06-15 LAB — IRON AND IRON BINDING CAPACITY (CC-WL,HP ONLY)
Iron: 38 ug/dL — ABNORMAL LOW (ref 45–182)
Saturation Ratios: 7 % — ABNORMAL LOW (ref 17.9–39.5)
TIBC: 526 ug/dL — ABNORMAL HIGH (ref 250–450)
UIBC: 489 ug/dL

## 2024-06-15 LAB — FERRITIN: Ferritin: 39 ng/mL (ref 24–336)

## 2024-06-15 NOTE — Progress Notes (Signed)
 " Hematology and Oncology Follow Up Visit  Jeff Aguirre [id number] 1961-05-13 64 y.o. 06/15/2024   Principle Diagnosis:  Hemochromatosis, heterozygous for the H63D mutation JAK2 negative but did have tier II variants of potential significance (ASXL1 p.Lys523Ter and  TP53 p.[id number])   Current Therapy:        Phlebotomy to maintain Hct < 45%, iron saturation < 50% and ferritin < 100 EC ASA 81 mg p.o. daily      Interim History:  Jeff Aguirre is here today for follow-up. He is having a biopsy of the prostate on 2/25 for steady elevation in PSA. He has had an upset stomach today so we will push his phlebotomy out a week or so.  No fever, chills, n/v, cough, rash, dizziness, SOB, chest pain, palpitations or changes in bladder habits.  No swelling, numbness or tingling in his extremities at this time.  No falls or syncope reported.  Appetite and hydration are good. Weight is stable at 167 lbs.   ECOG Performance Status: 1 - Symptomatic but completely ambulatory  Medications:  Allergies as of 06/15/2024       Reactions   Morphine Itching        Medication List        Accurate as of June 15, 2024 10:33 AM. If you have any questions, ask your nurse or doctor.          Aspir-Low 81 MG tablet Generic drug: aspirin  EC Take 81 mg by mouth daily.   benzonatate  200 MG capsule Commonly known as: TESSALON  Take 1 capsule (200 mg total) by mouth 2 (two) times daily as needed for cough.   lisinopril  5 MG tablet Commonly known as: ZESTRIL  Take 1 tablet by mouth once daily   metoprolol  succinate 50 MG 24 hr tablet Commonly known as: TOPROL -XL Take 1 tablet by mouth once daily        Allergies: Allergies[1]  Past Medical History, Surgical history, Social history, and Family History were reviewed and updated.  Review of Systems: All other 10 point review of systems is negative.   Physical Exam:  vitals were not taken for this visit.   Wt Readings from Last 3  Encounters:  05/25/24 167 lb (75.8 kg)  05/25/24 167 lb (75.8 kg)  05/05/24 162 lb (73.5 kg)    Ocular: Sclerae unicteric, pupils equal, round and reactive to light Ear-nose-throat: Oropharynx clear, dentition fair Lymphatic: No cervical or supraclavicular adenopathy Lungs no rales or rhonchi, good excursion bilaterally Heart regular rate and rhythm, no murmur appreciated Abd soft, nontender, positive bowel sounds MSK no focal spinal tenderness, no joint edema Neuro: non-focal, well-oriented, appropriate affect Breasts: Deferred   Lab Results  Component Value Date   WBC 6.2 06/15/2024   HGB 15.8 06/15/2024   HCT 47.5 06/15/2024   MCV 88.5 06/15/2024   PLT 273 06/15/2024   Lab Results  Component Value Date   FERRITIN 45 04/13/2024   IRON 38 (L) 04/13/2024   TIBC 511 (H) 04/13/2024   UIBC 473 04/13/2024   IRONPCTSAT 7 (L) 04/13/2024   Lab Results  Component Value Date   RETICCTPCT 2.0 12/15/2023   RBC 5.37 06/15/2024   No results found for: KPAFRELGTCHN, LAMBDASER, KAPLAMBRATIO No results found for: IGGSERUM, IGA, IGMSERUM No results found for: STEPHANY RINGS, A1GS, A2GS, BETS, BETA2SER, GAMS, MSPIKE, SPEI   Chemistry      Component Value Date/Time   NA 137 05/05/2024 0823   K 3.5 05/05/2024 0823   CL 102 05/05/2024  0823   CO2 27 05/05/2024 0823   BUN 9 05/05/2024 0823   CREATININE 0.75 05/05/2024 0823   CREATININE 0.78 04/13/2024 0821   CREATININE 0.64 (L) 10/25/2020 0000      Component Value Date/Time   CALCIUM  9.4 05/05/2024 0823   ALKPHOS 97 05/05/2024 0823   AST 33 05/05/2024 0823   AST 69 (H) 04/13/2024 0821   ALT 34 05/05/2024 0823   ALT 56 (H) 04/13/2024 0821   BILITOT 0.4 05/05/2024 0823   BILITOT 0.5 04/13/2024 9178       Impression and Plan:  Jeff Aguirre is a very pleasant 64 yo caucasian gentleman with history of hemochromatosis, heterozygous for the H63D mutation and a year long history of  erythrocytosis, JAK 2 negative but was noted to have a couple other variant of potential significance. Of note, he does smoke.  Iron studies are pending. So far he has remained well within his set parameters.  We will phlebotomize him next week when he is feeling better for Hct 47%.  Follow-up in 8 weeks.    Lauraine Pepper, NP 1/21/202610:33 AM     [1]  Allergies Allergen Reactions   Morphine Itching   "

## 2024-06-21 ENCOUNTER — Inpatient Hospital Stay

## 2024-06-21 MED ORDER — SODIUM CHLORIDE 0.9 % IV SOLN: Freq: Once | INTRAVENOUS | Status: DC

## 2024-06-21 NOTE — Patient Instructions (Signed)

## 2024-06-21 NOTE — Progress Notes (Signed)
 Jeff Aguirre presents today for phlebotomy per MD orders. Phlebotomy procedure started at 1258 and ended at 1307. 575 grams removed. Patient observed for 15 minutes after procedure without any incident per pt request. Patient tolerated procedure well. IV needle removed intact.

## 2024-06-28 ENCOUNTER — Other Ambulatory Visit: Payer: Self-pay | Admitting: Family Medicine

## 2024-06-28 DIAGNOSIS — I10 Essential (primary) hypertension: Secondary | ICD-10-CM

## 2024-07-20 ENCOUNTER — Inpatient Hospital Stay (HOSPITAL_BASED_OUTPATIENT_CLINIC_OR_DEPARTMENT_OTHER): Admission: RE | Admit: 2024-07-20 | Source: Ambulatory Visit

## 2024-07-20 ENCOUNTER — Other Ambulatory Visit: Admitting: Urology

## 2024-08-10 ENCOUNTER — Inpatient Hospital Stay

## 2024-08-10 ENCOUNTER — Inpatient Hospital Stay: Admitting: Family

## 2024-08-29 ENCOUNTER — Ambulatory Visit: Admitting: Family Medicine

## 2024-08-30 ENCOUNTER — Ambulatory Visit
